# Patient Record
Sex: Male | Born: 1950 | Race: White | Hispanic: No | Marital: Married | Smoking: Former smoker
Health system: Southern US, Community
[De-identification: ages and names within clinical notes are randomized; demographics above are authoritative.]

## PROBLEM LIST (undated history)

## (undated) DIAGNOSIS — I77819 Aortic ectasia, unspecified site: Secondary | ICD-10-CM

## (undated) DIAGNOSIS — Z87442 Personal history of urinary calculi: Secondary | ICD-10-CM

## (undated) DIAGNOSIS — R0600 Dyspnea, unspecified: Secondary | ICD-10-CM

## (undated) DIAGNOSIS — R011 Cardiac murmur, unspecified: Secondary | ICD-10-CM

## (undated) DIAGNOSIS — R1310 Dysphagia, unspecified: Secondary | ICD-10-CM

## (undated) DIAGNOSIS — N200 Calculus of kidney: Secondary | ICD-10-CM

## (undated) DIAGNOSIS — K219 Gastro-esophageal reflux disease without esophagitis: Secondary | ICD-10-CM

## (undated) DIAGNOSIS — R002 Palpitations: Secondary | ICD-10-CM

## (undated) DIAGNOSIS — E785 Hyperlipidemia, unspecified: Secondary | ICD-10-CM

## (undated) DIAGNOSIS — R0602 Shortness of breath: Secondary | ICD-10-CM

## (undated) DIAGNOSIS — Z860100 Personal history of colon polyps, unspecified: Secondary | ICD-10-CM

## (undated) DIAGNOSIS — I350 Nonrheumatic aortic (valve) stenosis: Secondary | ICD-10-CM

## (undated) DIAGNOSIS — R351 Nocturia: Secondary | ICD-10-CM

## (undated) DIAGNOSIS — I77811 Abdominal aortic ectasia: Secondary | ICD-10-CM

## (undated) HISTORY — PX: POLYPECTOMY: SHX149

## (undated) HISTORY — DX: Personal history of colon polyps, unspecified: Z86.0100

## (undated) HISTORY — DX: Cardiac murmur, unspecified: R01.1

## (undated) HISTORY — DX: Shortness of breath: R06.02

## (undated) HISTORY — DX: Abdominal aortic ectasia: I77.811

## (undated) HISTORY — DX: Dysphagia, unspecified: R13.10

## (undated) HISTORY — DX: Nonrheumatic aortic (valve) stenosis: I35.0

## (undated) HISTORY — DX: Calculus of kidney: N20.0

## (undated) HISTORY — PX: VASECTOMY: SHX75

## (undated) HISTORY — DX: Aortic ectasia, unspecified site: I77.819

## (undated) HISTORY — DX: Personal history of urinary calculi: Z87.442

## (undated) HISTORY — PX: COLONOSCOPY: SHX174

## (undated) HISTORY — DX: Nocturia: R35.1

## (undated) HISTORY — DX: Gastro-esophageal reflux disease without esophagitis: K21.9

## (undated) HISTORY — DX: Palpitations: R00.2

## (undated) HISTORY — DX: Personal history of colonic polyps: Z86.010

## (undated) HISTORY — DX: Hyperlipidemia, unspecified: E78.5

## (undated) HISTORY — DX: Dyspnea, unspecified: R06.00

---

## 1999-08-22 ENCOUNTER — Encounter: Payer: Self-pay | Admitting: Emergency Medicine

## 1999-08-22 ENCOUNTER — Emergency Department (HOSPITAL_COMMUNITY): Admission: EM | Admit: 1999-08-22 | Discharge: 1999-08-22 | Payer: Self-pay | Admitting: Emergency Medicine

## 2003-11-28 ENCOUNTER — Ambulatory Visit (HOSPITAL_COMMUNITY): Admission: RE | Admit: 2003-11-28 | Discharge: 2003-11-28 | Payer: Self-pay | Admitting: Gastroenterology

## 2005-03-03 HISTORY — PX: KIDNEY STONE SURGERY: SHX686

## 2005-04-11 ENCOUNTER — Encounter: Admission: RE | Admit: 2005-04-11 | Discharge: 2005-07-10 | Payer: Self-pay | Admitting: Family Medicine

## 2005-12-15 ENCOUNTER — Ambulatory Visit (HOSPITAL_BASED_OUTPATIENT_CLINIC_OR_DEPARTMENT_OTHER): Admission: RE | Admit: 2005-12-15 | Discharge: 2005-12-15 | Payer: Self-pay | Admitting: Urology

## 2005-12-26 ENCOUNTER — Ambulatory Visit (HOSPITAL_COMMUNITY): Admission: RE | Admit: 2005-12-26 | Discharge: 2005-12-26 | Payer: Self-pay | Admitting: Urology

## 2012-03-23 ENCOUNTER — Encounter: Payer: Self-pay | Admitting: Gastroenterology

## 2012-04-19 ENCOUNTER — Ambulatory Visit (AMBULATORY_SURGERY_CENTER): Payer: PRIVATE HEALTH INSURANCE | Admitting: *Deleted

## 2012-04-19 VITALS — Ht 70.0 in | Wt 187.6 lb

## 2012-04-19 DIAGNOSIS — Z86010 Personal history of colonic polyps: Secondary | ICD-10-CM

## 2012-04-19 DIAGNOSIS — Z1211 Encounter for screening for malignant neoplasm of colon: Secondary | ICD-10-CM

## 2012-04-19 MED ORDER — MOVIPREP 100 G PO SOLR: ORAL | Status: DC

## 2012-04-19 NOTE — Progress Notes (Signed)
Patient had colonoscopy with Dr.Weissman 2005 and 2008. Adenomatous Polyp removed in 2005. Reports given by patient. Sent to be scanned into chart. Copies also given to [facility].

## 2012-05-03 ENCOUNTER — Ambulatory Visit (AMBULATORY_SURGERY_CENTER): Payer: PRIVATE HEALTH INSURANCE | Admitting: Gastroenterology

## 2012-05-03 ENCOUNTER — Encounter: Payer: Self-pay | Admitting: Gastroenterology

## 2012-05-03 VITALS — BP 114/71 | HR 71 | Temp 97.9°F | Resp 20 | Ht 70.0 in | Wt 187.0 lb

## 2012-05-03 DIAGNOSIS — D126 Benign neoplasm of colon, unspecified: Secondary | ICD-10-CM

## 2012-05-03 DIAGNOSIS — Z1211 Encounter for screening for malignant neoplasm of colon: Secondary | ICD-10-CM

## 2012-05-03 DIAGNOSIS — K5730 Diverticulosis of large intestine without perforation or abscess without bleeding: Secondary | ICD-10-CM

## 2012-05-03 DIAGNOSIS — Z86010 Personal history of colonic polyps: Secondary | ICD-10-CM

## 2012-05-03 MED ORDER — SODIUM CHLORIDE 0.9 % IV SOLN: 500.0000 mL | INTRAVENOUS | Status: DC

## 2012-05-03 NOTE — Progress Notes (Signed)
Patient did not experience any of the following events: a burn prior to discharge; a fall within the facility; wrong site/side/patient/procedure/implant event; or a hospital transfer or hospital admission upon discharge from the facility. (G8907) Patient did not have preoperative order for IV antibiotic SSI prophylaxis. (G8918)  

## 2012-05-03 NOTE — Patient Instructions (Signed)
YOU HAD AN ENDOSCOPIC PROCEDURE TODAY AT THE [facility]: Refer to the procedure report that was given to you for any specific questions about what was found during the examination.  If the procedure report does not answer your questions, please call your gastroenterologist to clarify.  If you requested that your care partner not be given the details of your procedure findings, then the procedure report has been included in a sealed envelope for you to review at your convenience later.  YOU SHOULD EXPECT: Some feelings of bloating in the abdomen. Passage of more gas than usual.  Walking can help get rid of the air that was put into your GI tract during the procedure and reduce the bloating. If you had a lower endoscopy (such as a colonoscopy or flexible sigmoidoscopy) you may notice spotting of blood in your stool or on the toilet paper. If you underwent a bowel prep for your procedure, then you may not have a normal bowel movement for a few days.  DIET: Your first meal following the procedure should be a light meal and then it is ok to progress to your normal diet.  A half-sandwich or bowl of soup is an example of a good first meal.  Heavy or fried foods are harder to digest and may make you feel nauseous or bloated.  Likewise meals heavy in dairy and vegetables can cause extra gas to form and this can also increase the bloating.  Drink plenty of fluids but you should avoid alcoholic beverages for 24 hours.  ACTIVITY: Your care partner should take you home directly after the procedure.  You should plan to take it easy, moving slowly for the rest of the day.  You can resume normal activity the day after the procedure however you should NOT DRIVE or use heavy machinery for 24 hours (because of the sedation medicines used during the test).    SYMPTOMS TO REPORT IMMEDIATELY: A gastroenterologist can be reached at any hour.  During normal business hours, 8:30 AM to 5:00 PM Monday through Friday,  call [phone].  After hours and on weekends, please call the GI answering service at [phone] who will take a message and have the physician on call contact you.   Following lower endoscopy (colonoscopy or flexible sigmoidoscopy):  Excessive amounts of blood in the stool  Significant tenderness or worsening of abdominal pains  Swelling of the abdomen that is new, acute  Fever of 100F or higher    FOLLOW UP: If any biopsies were taken you will be contacted by phone or by letter within the next 1-3 weeks.  Call your gastroenterologist if you have not heard about the biopsies in 3 weeks.  Our staff will call the home number listed on your records the next business day following your procedure to check on you and address any questions or concerns that you may have at that time regarding the information given to you following your procedure. This is a courtesy call and so if there is no answer at the home number and we have not heard from you through the emergency physician on call, we will assume that you have returned to your regular daily activities without incident.  SIGNATURES/CONFIDENTIALITY: You and/or your care partner have signed paperwork which will be entered into your electronic medical record.  These signatures attest to the fact that that the information above on your After Visit Summary has been reviewed and is understood.  Full responsibility of the confidentiality   of this discharge information lies with you and/or your care-partner.     

## 2012-05-03 NOTE — Op Note (Signed)
[facility] [address] [place], [address]   COLONOSCOPY PROCEDURE REPORT  PATIENT: Christian Galvan, Christian Galvan.  MR#: [id number] BIRTHDATE: 12/08/1950 , 61  yrs. old GENDER: Male ENDOSCOPIST: Rachael Fee, MD REFERRED LK:[id number] Cyndie Mull, M.D. PROCEDURE DATE:  05/03/2012 PROCEDURE:   Colonoscopy with snare polypectomy ASA CLASS:   Class II INDICATIONS:1.5cm adenomatous polyp removed 2005 (Dr.  Melchor Amour); repeat colonoscoyp 2008 was normal. MEDICATIONS: Fentanyl 50 mcg IV, Versed 6 mg IV, and These medications were titrated to patient response per physician's verbal order  DESCRIPTION OF PROCEDURE:   After the risks benefits and alternatives of the procedure were thoroughly explained, informed consent was obtained.  A digital rectal exam revealed no abnormalities of the rectum.   The LB [id number]  endoscope was introduced through the anus and advanced to the cecum, which was identified by both the appendix and ileocecal valve. No adverse events experienced.   The quality of the prep was good, using MoviPrep  The instrument was then slowly withdrawn as the colon was fully examined.  COLON FINDINGS: Two small polyps were found, removed and sent to pathology.  These were both sessile, 3-18mm across, located in transverse and rectum segments, removed with cold snare.  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  The examination was otherwise normal.  Retroflexed views revealed no abnormalities. The time to cecum=2 minutes 29 seconds.  Withdrawal time=9 minutes 03 seconds.  The scope was withdrawn and the procedure completed. COMPLICATIONS: There were no complications.  ENDOSCOPIC IMPRESSION: Two small polyps were found, removed and sent to pathology. 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 The examination was otherwise normal.  RECOMMENDATIONS: Given your personal history of adenomatous (pre-cancerous) polyps, you will need a repeat colonoscopy in 5  years even if the polyps removed today are NOT pre-cancerous   eSigned:  Rachael Fee, MD 05/03/2012 11:23 AM

## 2012-05-04 ENCOUNTER — Telehealth: Payer: Self-pay | Admitting: *Deleted

## 2012-05-04 NOTE — Telephone Encounter (Signed)
  Follow up Call-  Call back number 05/03/2012  Post procedure Call Back phone  # [phone]  Permission to leave phone message Yes     Patient questions:  Do you have a fever, pain , or abdominal swelling? no Pain Score  0 *  Have you tolerated food without any problems? yes  Have you been able to return to your normal activities? yes  Do you have any questions about your discharge instructions: Diet   no Medications  no Follow up visit  no  Do you have questions or concerns about your Care? no  Actions: * If pain score is 4 or above: No action needed, pain <4. Pt requested referring MD be corrected to Martha Clan instead of Buren Kos as on report , this chart given to [employer] who is going to change that in epic.

## 2012-05-04 NOTE — Telephone Encounter (Signed)
Called pt to let him know william and Buren Kos same MD as pt had been concerned that his report sent to wrong MD.

## 2012-05-10 ENCOUNTER — Encounter: Payer: Self-pay | Admitting: Gastroenterology

## 2016-05-16 ENCOUNTER — Other Ambulatory Visit: Payer: Self-pay | Admitting: Internal Medicine

## 2016-05-16 DIAGNOSIS — R011 Cardiac murmur, unspecified: Secondary | ICD-10-CM

## 2016-05-30 ENCOUNTER — Encounter (INDEPENDENT_AMBULATORY_CARE_PROVIDER_SITE_OTHER): Payer: Self-pay

## 2016-05-30 ENCOUNTER — Other Ambulatory Visit: Payer: Self-pay

## 2016-05-30 ENCOUNTER — Ambulatory Visit (HOSPITAL_COMMUNITY): Payer: Commercial Managed Care - HMO | Attending: Cardiology

## 2016-05-30 DIAGNOSIS — I071 Rheumatic tricuspid insufficiency: Secondary | ICD-10-CM | POA: Insufficient documentation

## 2016-05-30 DIAGNOSIS — I351 Nonrheumatic aortic (valve) insufficiency: Secondary | ICD-10-CM | POA: Insufficient documentation

## 2016-05-30 DIAGNOSIS — R011 Cardiac murmur, unspecified: Secondary | ICD-10-CM | POA: Insufficient documentation

## 2016-05-30 DIAGNOSIS — E785 Hyperlipidemia, unspecified: Secondary | ICD-10-CM | POA: Insufficient documentation

## 2017-05-13 ENCOUNTER — Encounter: Payer: Self-pay | Admitting: Gastroenterology

## 2017-06-16 ENCOUNTER — Ambulatory Visit (AMBULATORY_SURGERY_CENTER): Payer: Self-pay | Admitting: *Deleted

## 2017-06-16 ENCOUNTER — Other Ambulatory Visit: Payer: Self-pay

## 2017-06-16 ENCOUNTER — Encounter: Payer: Self-pay | Admitting: Gastroenterology

## 2017-06-16 VITALS — Ht 69.75 in | Wt 190.0 lb

## 2017-06-16 DIAGNOSIS — Z86010 Personal history of colonic polyps: Secondary | ICD-10-CM

## 2017-06-16 MED ORDER — PEG 3350-KCL-NA BICARB-NACL 420 G PO SOLR: 4000.0000 mL | Freq: Once | ORAL | 0 refills | Status: AC

## 2017-06-16 NOTE — Progress Notes (Signed)
No egg or soy allergy known to patient  No issues with past sedation with any surgeries  or procedures, no intubation problems - pt states slow to wake  No diet pills per patient No home 02 use per patient  No blood thinners per patient  Pt denies issues with constipation  No A fib or A flutter  EMMI video sent to pt's e mail - pt declined

## 2017-06-30 ENCOUNTER — Other Ambulatory Visit: Payer: Self-pay

## 2017-06-30 ENCOUNTER — Encounter: Payer: Self-pay | Admitting: Gastroenterology

## 2017-06-30 ENCOUNTER — Ambulatory Visit (AMBULATORY_SURGERY_CENTER): Payer: 59 | Admitting: Gastroenterology

## 2017-06-30 VITALS — BP 123/81 | HR 82 | Temp 98.2°F | Resp 10 | Ht 69.75 in | Wt 190.0 lb

## 2017-06-30 DIAGNOSIS — Z86010 Personal history of colonic polyps: Secondary | ICD-10-CM

## 2017-06-30 DIAGNOSIS — D125 Benign neoplasm of sigmoid colon: Secondary | ICD-10-CM | POA: Diagnosis not present

## 2017-06-30 DIAGNOSIS — D123 Benign neoplasm of transverse colon: Secondary | ICD-10-CM

## 2017-06-30 DIAGNOSIS — D122 Benign neoplasm of ascending colon: Secondary | ICD-10-CM

## 2017-06-30 MED ORDER — SODIUM CHLORIDE 0.9 % IV SOLN: 500.0000 mL | Freq: Once | INTRAVENOUS | Status: AC

## 2017-06-30 NOTE — Op Note (Signed)
[facility] Patient Name: Christian Galvan Procedure Date: 06/30/2017 1:47 PM MRN: [id number] Endoscopist: Milus Banister , MD Age: 67 Referring MD:  Date of Birth: 05/02/1950 Gender: Male Account #: [id number] Procedure:                Colonoscopy Indications:              High risk colon cancer surveillance: Personal                            history of colonic polyps; 1.5cm adenomatous polyp                            removed 2005 (Dr. Redmond School); repeat colonoscopy Dr.                            Redmond School 2008 was normal; colonoscopy 2014 Dr.                            Ardis Hughs found two subCM adenomas. Medicines:                Monitored Anesthesia Care Procedure:                Pre-Anesthesia Assessment:                           - Prior to the procedure, a History and Physical                            was performed, and patient medications and                            allergies were reviewed. The patient's tolerance of                            previous anesthesia was also reviewed. The risks                            and benefits of the procedure and the sedation                            options and risks were discussed with the patient.                            All questions were answered, and informed consent                            was obtained. Prior Anticoagulants: The patient has                            taken no previous anticoagulant or antiplatelet                            agents. ASA Grade Assessment: II - A patient with  mild systemic disease. After reviewing the risks                            and benefits, the patient was deemed in                            satisfactory condition to undergo the procedure.                           After obtaining informed consent, the colonoscope                            was passed under direct vision. Throughout the                            procedure, the patient's blood  pressure, pulse, and                            oxygen saturations were monitored continuously. The                            Colonoscope was introduced through the anus and                            advanced to the the cecum, identified by                            appendiceal orifice and ileocecal valve. The                            colonoscopy was performed without difficulty. The                            patient tolerated the procedure well. The quality                            of the bowel preparation was good. The ileocecal                            valve, appendiceal orifice, and rectum were                            photographed. Scope In: 1:54:26 PM Scope Out: 2:06:15 PM Scope Withdrawal Time: 0 hours 9 minutes 57 seconds  Total Procedure Duration: 0 hours 11 minutes 49 seconds  Findings:                 Two sessile polyps were found in the transverse                            colon and ascending colon. The polyps were 2 to 3                            mm in size. These polyps were removed with a cold  snare. Resection and retrieval were complete.                           Lipomatous (more than usual) IC valve.                           The exam was otherwise without abnormality on                            direct and retroflexion views. Complications:            No immediate complications. Estimated blood loss:                            None. Estimated Blood Loss:     Estimated blood loss: none. Impression:               - Two 2 to 3 mm polyps in the transverse colon and                            in the ascending colon, removed with a cold snare.                            Resected and retrieved.                           - The examination was otherwise normal on direct                            and retroflexion views. Recommendation:           - Patient has a contact number available for                            emergencies. The  signs and symptoms of potential                            delayed complications were discussed with the                            patient. Return to normal activities tomorrow.                            Written discharge instructions were provided to the                            patient.                           - Resume previous diet.                           - Continue present medications.                           You will receive a letter within 2-3 weeks with the  pathology results and my final recommendations.                           If the polyp(s) is proven to be 'pre-cancerous' on                            pathology, you will need repeat colonoscopy in 5                            years. Milus Banister, MD 06/30/2017 2:12:52 PM This report has been signed electronically.

## 2017-06-30 NOTE — Progress Notes (Signed)
Report given to PACU, vss 

## 2017-06-30 NOTE — Patient Instructions (Signed)
YOU HAD AN ENDOSCOPIC PROCEDURE TODAY AT THE [facility]:   Refer to the procedure report that was given to you for any specific questions about what was found during the examination.  If the procedure report does not answer your questions, please call your gastroenterologist to clarify.  If you requested that your care partner not be given the details of your procedure findings, then the procedure report has been included in a sealed envelope for you to review at your convenience later.  YOU SHOULD EXPECT: Some feelings of bloating in the abdomen. Passage of more gas than usual.  Walking can help get rid of the air that was put into your GI tract during the procedure and reduce the bloating. If you had a lower endoscopy (such as a colonoscopy or flexible sigmoidoscopy) you may notice spotting of blood in your stool or on the toilet paper. If you underwent a bowel prep for your procedure, you may not have a normal bowel movement for a few days.  Please Note:  You might notice some irritation and congestion in your nose or some drainage.  This is from the oxygen used during your procedure.  There is no need for concern and it should clear up in a day or so.  SYMPTOMS TO REPORT IMMEDIATELY:   Following lower endoscopy (colonoscopy or flexible sigmoidoscopy):  Excessive amounts of blood in the stool  Significant tenderness or worsening of abdominal pains  Swelling of the abdomen that is new, acute  Fever of 100F or higher  Please see handouts given to you on polyps.  For urgent or emergent issues, a gastroenterologist can be reached at any hour by calling [phone].   DIET:  We do recommend a small meal at first, but then you may proceed to your regular diet.  Drink plenty of fluids but you should avoid alcoholic beverages for 24 hours.  ACTIVITY:  You should plan to take it easy for the rest of today and you should NOT DRIVE or use heavy machinery until tomorrow (because of the  sedation medicines used during the test).    FOLLOW UP: Our staff will call the number listed on your records the next business day following your procedure to check on you and address any questions or concerns that you may have regarding the information given to you following your procedure. If we do not reach you, we will leave a message.  However, if you are feeling well and you are not experiencing any problems, there is no need to return our call.  We will assume that you have returned to your regular daily activities without incident.  If any biopsies were taken you will be contacted by phone or by letter within the next 1-3 weeks.  Please call us at [phone] if you have not heard about the biopsies in 3 weeks.    SIGNATURES/CONFIDENTIALITY: You and/or your care partner have signed paperwork which will be entered into your electronic medical record.  These signatures attest to the fact that that the information above on your After Visit Summary has been reviewed and is understood.  Full responsibility of the confidentiality of this discharge information lies with you and/or your care-partner.  Thank you for letting us take care of your healthcare needs today. 

## 2017-06-30 NOTE — Progress Notes (Signed)
Called to room to assist during endoscopic procedure.  Patient ID and intended procedure confirmed with present staff. Received instructions for my participation in the procedure from the performing physician.  

## 2017-06-30 NOTE — Progress Notes (Signed)
Pt. Reports no change in his medical or surgical history since his pre-visit 06/16/2017.

## 2017-07-01 ENCOUNTER — Telehealth: Payer: Self-pay

## 2017-07-01 NOTE — Telephone Encounter (Signed)
  Follow up Call-  Call Corrisa Gibby number 06/30/2017  Post procedure Call Lucus Lambertson phone  # [phone]  Permission to leave phone message Yes  Some recent data might be hidden     Patient questions:  Do you have a fever, pain , or abdominal swelling? No. Pain Score  0 *  Have you tolerated food without any problems? Yes.    Have you been able to return to your normal activities? Yes.    Do you have any questions about your discharge instructions: Diet   No. Medications  No. Follow up visit  No.  Do you have questions or concerns about your Care? No.  Actions: * If pain score is 4 or above: No action needed, pain <4.

## 2017-07-07 ENCOUNTER — Encounter: Payer: Self-pay | Admitting: Gastroenterology

## 2018-05-24 ENCOUNTER — Other Ambulatory Visit: Payer: Self-pay | Admitting: Internal Medicine

## 2018-05-24 DIAGNOSIS — Z0000 Encounter for general adult medical examination without abnormal findings: Secondary | ICD-10-CM

## 2018-07-20 ENCOUNTER — Ambulatory Visit: Payer: 59

## 2018-08-03 ENCOUNTER — Ambulatory Visit
Admission: RE | Admit: 2018-08-03 | Discharge: 2018-08-03 | Disposition: A | Discharge status: Home / Self Care | Payer: Medicare Other | Source: Ambulatory Visit | Attending: Internal Medicine | Admitting: Internal Medicine

## 2018-08-03 DIAGNOSIS — Z0000 Encounter for general adult medical examination without abnormal findings: Secondary | ICD-10-CM

## 2019-03-29 ENCOUNTER — Ambulatory Visit: Payer: Medicare Other

## 2019-04-07 ENCOUNTER — Ambulatory Visit: Payer: Medicare Other

## 2019-04-15 ENCOUNTER — Ambulatory Visit: Payer: Medicare Other

## 2019-04-26 ENCOUNTER — Other Ambulatory Visit (HOSPITAL_COMMUNITY): Payer: Self-pay | Admitting: Internal Medicine

## 2019-04-26 DIAGNOSIS — I350 Nonrheumatic aortic (valve) stenosis: Secondary | ICD-10-CM

## 2019-05-10 ENCOUNTER — Ambulatory Visit (HOSPITAL_COMMUNITY): Payer: Medicare Other | Attending: Cardiology

## 2019-05-10 ENCOUNTER — Other Ambulatory Visit: Payer: Self-pay

## 2019-05-10 DIAGNOSIS — I350 Nonrheumatic aortic (valve) stenosis: Secondary | ICD-10-CM

## 2019-05-25 NOTE — Progress Notes (Signed)
CARDIOLOGY CONSULT NOTE       Patient ID: Christian Galvan MRN: [id number] DOB/AGE: 67-Jul-1952 67 y.o.  Admit date: (Not on file) Referring Physician: Brigitte Pulse Primary Physician: Marton Redwood, MD Primary Cardiologist: New Reason for Consultation: Aortic Valve Disease  Active Problems:   * No active hospital problems. *   HPI:  69 y.o. referred by Dr Brigitte Pulse for aortic valve disease, palpitations. He is a former smoker with HLD on statin .  He had TTE done 05/30/16 with EF 60-65% mild AS/AR mean gradient 13 mmHg peak 25 mmHg Aortic root measured 4.4 cm. F/U echo done 05/10/19 showed progression of disease with mild to moderate AR and moderate AS mean gradient 18 peak 31.5 mmHg DVI 0.33 and AVA 1.54 cm2 Aortic root measured at 4.3 cm He has also had and abdominal US with ectasia of the abdominal aorta but only measuring 2.7 cm  Done as a AAA screening exam  He drinks 4 or >days/week with 3-4 drinks/day Quit smoking in 1970   ECG NSR normal Labs ok Hct 49.8  LDL 48 TSH 1.3  CXR NAD  Complains of resting dyspnea and then palpitations  No chest pain Just started going back to [facility] but sedentary with COVID Dyspnea worse last 2 months especially after lunch   For 2 years he has been active at [facility] without issue. He and wife are retired. 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 Need for aortic aneurysm surveillance W/U of ventricular Ectopy and need to r/o concomitant CAD   ROS All other systems reviewed and negative except as noted above  Past Medical History:  Diagnosis Date  . Abdominal aortic ectasia ([place])   . Aortic dilatation ([facility])   . Aortic stenosis   . Dysphagia   . Dyspnea   . GERD (gastroesophageal reflux disease)   . History of kidney stones   . Hx of colonic polyps   . Hyperlipidemia   . Nephrolithiasis   . Nocturia   . Palpitations   . SOB  (shortness of breath)     Family History  Problem Relation Age of Onset  . Heart disease Father   . Other Mother        old age  . Healthy Sister   . Colon cancer Neg Hx   . Colon polyps Neg Hx   . Esophageal cancer Neg Hx   . Rectal cancer Neg Hx   . Stomach cancer Neg Hx     Social History   Socioeconomic History  . Marital status: Married    Spouse name: Not on file  . Number of children: Not on file  . Years of education: Not on file  . Highest education level: Not on file  Occupational History  . Not on file  Tobacco Use  . Smoking status: Former Smoker    Types: Cigarettes  . Smokeless tobacco: Never Used  Substance and Sexual Activity  . Alcohol use: Yes    Alcohol/week: 2.0 standard drinks    Types: 2 Glasses of wine per week    Comment: wine nightly  . Drug use: No  . Sexual activity: Not on file  Other Topics Concern  . Not on file  Social History Narrative  . Not on file   Social Determinants of Health   Financial Resource Strain:   . Difficulty of  Paying Living Expenses:   Food Insecurity:   . Worried About Charity fundraiser in the Last Year:   . Arboriculturist in the Last Year:   Transportation Needs:   . Film/video editor (Medical):   Marland Kitchen Lack of Transportation (Non-Medical):   Physical Activity:   . Days of Exercise per Week:   . Minutes of Exercise per Session:   Stress:   . Feeling of Stress :   Social Connections:   . Frequency of Communication with Friends and Family:   . Frequency of Social Gatherings with Friends and Family:   . Attends Religious Services:   . Active Member of Clubs or Organizations:   . Attends Archivist Meetings:   Marland Kitchen Marital Status:   Intimate Partner Violence:   . Fear of Current or Ex-Partner:   . Emotionally Abused:   Marland Kitchen Physically Abused:   . Sexually Abused:     Past Surgical History:  Procedure Laterality Date  . COLONOSCOPY  2005&2008   Dr.Weissman  . KIDNEY STONE SURGERY  2007  .  POLYPECTOMY    . VASECTOMY        Current Outpatient Medications:  .  atorvastatin (LIPITOR) 20 MG tablet, , Disp: , Rfl: 11 .  pantoprazole (PROTONIX) 40 MG tablet, Take 40 mg by mouth daily., Disp: , Rfl:   Current Facility-Administered Medications:  .  0.9 %  sodium chloride infusion, 500 mL, Intravenous, Once, Milus Banister, MD  . sodium chloride      Physical Exam: Blood pressure 118/78, pulse 94, height 5\' 9"  (1.753 m), weight 200 lb (90.7 kg), SpO2 98 %.    Affect appropriate Healthy:  appears stated age 69: normal Neck supple with no adenopathy JVP normal no bruits no thyromegaly Lungs clear with no wheezing and good diaphragmatic motion Heart:  S1/S2 preserved AS/AR  murmur, no rub, gallop or click PMI normal Abdomen: benighn, BS positve, no tenderness, no AAA no bruit.  No HSM or HJR Distal pulses intact with no bruits No edema Neuro non-focal Skin warm and dry No muscular weakness    Radiology: ECHOCARDIOGRAM COMPLETE  Result Date: 05/10/2019    ECHOCARDIOGRAM REPORT   Patient Name:   Christian Galvan [facility] Date of Exam: 05/10/2019 Medical Rec #:  [id number]        Height:       69.8 in Accession #:    [id number]       Weight:       190.0 lb Date of Birth:  03/21/1950         BSA:          2.037 m Patient Age:    69 years         BP:           110/60 mmHg Patient Gender: M                HR:           86 bpm. Exam Location:  [place] Procedure: 2D Echo, 3D Echo, Cardiac Doppler and Color Doppler Indications:    I35.0 Aortic Stenosis  History:        Patient has prior history of Echocardiogram examinations, most                 recent 05/30/2016. Aortic Valve Disease, Signs/Symptoms:Shortness                 of Breath; Risk Factors:Dyslipidemia, Former Smoker  and Family                 History of Coronary Artery Disease. Palpitations, Aortic                 Stenosis.  Sonographer:    Deliah Boston RDCS Referring Phys: [place]  1. Left  ventricular ejection fraction, by estimation, is 65 to 70%. The left ventricle has normal function. The left ventricle has no regional wall motion abnormalities. Left ventricular diastolic parameters are indeterminate.  2. Right ventricular systolic function is normal. The right ventricular size is normal. Tricuspid regurgitation signal is inadequate for assessing PA pressure.  3. The mitral valve is normal in structure. No evidence of mitral valve regurgitation.  4. The aortic valve is abnormal. Moderately calcified. Aortic valve regurgitation is mild to moderate. Moderate aortic valve stenosis. Vmax 3.0, MG 20 mmHg, AVA 1.7 cm^2, DI 0.3  5. Aortic dilatation noted. There is dilatation of the ascending aorta measuring 43 mm.  6. The inferior vena cava is normal in size with greater than 50% respiratory variability, suggesting right atrial pressure of 3 mmHg. FINDINGS  Left Ventricle: Left ventricular ejection fraction, by estimation, is 65 to 70%. The left ventricle has normal function. The left ventricle has no regional wall motion abnormalities. The left ventricular internal cavity size was normal in size. There is  no left ventricular hypertrophy. Left ventricular diastolic parameters are indeterminate. Right Ventricle: The right ventricular size is normal. No increase in right ventricular wall thickness. Right ventricular systolic function is normal. Tricuspid regurgitation signal is inadequate for assessing PA pressure. Left Atrium: Left atrial size was normal in size. Right Atrium: Right atrial size was normal in size. Pericardium: There is no evidence of pericardial effusion. Mitral Valve: The mitral valve is normal in structure. No evidence of mitral valve regurgitation. Tricuspid Valve: The tricuspid valve is normal in structure. Tricuspid valve regurgitation is trivial. Aortic Valve: The aortic valve is abnormal. Aortic valve regurgitation is mild to moderate. Aortic regurgitation PHT measures 400 msec.  Moderate aortic stenosis is present. There is moderate calcification of the aortic valve. Aortic valve mean gradient measures 18.0 mmHg. Aortic valve peak gradient measures 31.5 mmHg. Aortic valve area, by VTI measures 1.77 cm. Pulmonic Valve: The pulmonic valve was not well visualized. Pulmonic valve regurgitation is trivial. Aorta: Aortic dilatation noted. There is dilatation of the ascending aorta measuring 43 mm. Venous: The inferior vena cava is normal in size with greater than 50% respiratory variability, suggesting right atrial pressure of 3 mmHg. IAS/Shunts: The interatrial septum was not well visualized.  LEFT VENTRICLE PLAX 2D LVIDd:         4.12 cm  Diastology LVIDs:         2.46 cm  LV e' lateral:   6.37 cm/s LV PW:         0.88 cm  LV E/e' lateral: 9.6 LV IVS:        0.86 cm  LV e' medial:    5.44 cm/s LVOT diam:     2.60 cm  LV E/e' medial:  11.2 LV SV:         107 LV SV Index:   52 LVOT Area:     5.31 cm  RIGHT VENTRICLE RV S prime:     11.80 cm/s TAPSE (M-mode): 1.9 cm LEFT ATRIUM             Index       RIGHT ATRIUM  Index LA diam:        3.70 cm 1.82 cm/m  RA Area:     14.40 cm LA Vol (A2C):   51.4 ml 25.23 ml/m RA Volume:   33.80 ml  16.59 ml/m LA Vol (A4C):   36.6 ml 17.96 ml/m LA Biplane Vol: 44.7 ml 21.94 ml/m  AORTIC VALVE AV Area (Vmax):    1.54 cm AV Area (Vmean):   1.52 cm AV Area (VTI):     1.77 cm AV Vmax:           280.67 cm/s AV Vmean:          195.333 cm/s AV VTI:            0.604 m AV Peak Grad:      31.5 mmHg AV Mean Grad:      18.0 mmHg LVOT Vmax:         81.30 cm/s LVOT Vmean:        56.000 cm/s LVOT VTI:          0.201 m LVOT/AV VTI ratio: 0.33 AI PHT:            400 msec  AORTA Ao Root diam: 3.75 cm Ao Asc diam:  4.27 cm MITRAL VALVE MV Area (PHT): cm          SHUNTS MV Decel Time: 275 msec     Systemic VTI:  0.20 m MV E velocity: 61.10 cm/s   Systemic Diam: 2.60 cm MV A velocity: 100.00 cm/s MV E/A ratio:  0.61 Oswaldo Milian MD Electronically signed  by Oswaldo Milian MD Signature Date/Time: 05/10/2019/8:19:16 PM    Final     EKG: SR rate 94 normal QT PVCls    ASSESSMENT AND PLAN:   1. 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 AS/AR note surgical with preserved LV function will need f/u echo in a year and close f/u   2. Aortic Aneurysm:  Moderate dilatation on limited view of ascending root TTE. Particularly in setting of possible bicuspid morphology need CTA to further evaluate entire root. His HR is too high with PVCls to do cardiac CTA so will do non gated chest CTA   3. HLD  Continue statin labs with primary   4. PVC;s will get 14 day Zio patch to quant itate and lexiscan myovue to r/o CAD suspect he will benefit from initiation of beta blocker after testing done   Signed: Jenkins Rouge 05/27/2019, 3:32 PM

## 2019-05-27 ENCOUNTER — Encounter: Payer: Self-pay | Admitting: *Deleted

## 2019-05-27 ENCOUNTER — Encounter: Payer: Self-pay | Admitting: Cardiovascular Disease

## 2019-05-27 ENCOUNTER — Other Ambulatory Visit: Payer: Self-pay

## 2019-05-27 ENCOUNTER — Ambulatory Visit: Payer: Medicare Other | Admitting: Cardiovascular Disease

## 2019-05-27 ENCOUNTER — Telehealth: Payer: Self-pay | Admitting: Radiology

## 2019-05-27 VITALS — BP 118/78 | HR 94 | Ht 69.0 in | Wt 200.0 lb

## 2019-05-27 DIAGNOSIS — I350 Nonrheumatic aortic (valve) stenosis: Secondary | ICD-10-CM | POA: Diagnosis not present

## 2019-05-27 DIAGNOSIS — I493 Ventricular premature depolarization: Secondary | ICD-10-CM | POA: Diagnosis not present

## 2019-05-27 DIAGNOSIS — R0602 Shortness of breath: Secondary | ICD-10-CM

## 2019-05-27 DIAGNOSIS — I491 Atrial premature depolarization: Secondary | ICD-10-CM | POA: Diagnosis not present

## 2019-05-27 NOTE — Patient Instructions (Addendum)
Medication Instructions:  Your physician recommends that you continue on your current medications as directed. Please refer to the Current Medication list given to you today.  *If you need a refill on your cardiac medications before your next appointment, please call your pharmacy*   Lab Work: TODAY:  BMET  If you have labs (blood work) drawn today and your tests are completely normal, you will receive your results only by: Marland Kitchen MyChart Message (if you have MyChart) OR . A paper copy in the mail If you have any lab test that is abnormal or we need to change your treatment, we will call you to review the results.   Testing/Procedures: Non-Cardiac CT Angiography (CTA), is a special type of CT scan that uses a computer to produce multi-dimensional views of major blood vessels throughout the body. In CT angiography, a contrast material is injected through an IV to help visualize the blood vessels   Your physician has requested that you have a lexiscan myoview. For further information please visit [email or web address]. Please follow instruction sheet, as given.  ZIO XT- Long Term Monitor Instructions   Your physician has requested you wear your ZIO patch monitor 14 days.   This is a single patch monitor.  [employer] supplies one patch monitor per enrollment.  Additional stickers are not available.   Please do not apply patch if you will be having a Nuclear Stress Test, Echocardiogram, Cardiac CT, MRI, or Chest Xray during the time frame you would be wearing the monitor. The patch cannot be worn during these tests.  You cannot remove and re-apply the ZIO XT patch monitor.   Your ZIO patch monitor will be sent USPS Priority mail from [facility] directly to your home address. The monitor may also be mailed to a PO BOX if home delivery is not available.   It may take 3-5 days to receive your monitor after you have been enrolled.   Once you have received you monitor, please review enclosed  instructions.  Your monitor has already been registered assigning a specific monitor serial # to you.   Applying the monitor   Shave hair from upper left chest.   Hold abrader disc by orange tab.  Rub abrader in 40 strokes over left upper chest as indicated in your monitor instructions.   Clean area with 4 enclosed alcohol pads .  Use all pads to assure are is cleaned thoroughly.  Let dry.   Apply patch as indicated in monitor instructions.  Patch will be place under collarbone on left side of chest with arrow pointing upward.   Rub patch adhesive wings for 2 minutes.Remove white label marked "1".  Remove white label marked "2".  Rub patch adhesive wings for 2 additional minutes.   While looking in a mirror, press and release button in center of patch.  A small green light will flash 3-4 times .  This will be your only indicator the monitor has been turned on.     Do not shower for the first 24 hours.  You may shower after the first 24 hours.   Press button if you feel a symptom. You will hear a small click.  Record Date, Time and Symptom in the Patient Log Book.   When you are ready to remove patch, follow instructions on last 2 pages of Patient Log Book.  Stick patch monitor onto last page of Patient Log Book.   Place Patient Log Book in [place] box.  Use locking tab on box  and tape box closed securely.  The [employer] has [employer] on it.  Please place in mailbox as soon as possible.  Your physician should have your test results approximately 7 days after the monitor has been mailed back to [facility].   Call [place] at [phone] if you have questions regarding your ZIO XT patch monitor.  Call them immediately if you see an orange light blinking on your monitor.   If your monitor falls off in less than 4 days contact our Monitor department at [phone].  If your monitor becomes loose or falls off after 4 days call [employer] at [phone] for  suggestions on securing your monitor.     Follow-Up: At [facility], you and your health needs are our priority.  As part of our continuing mission to provide you with exceptional heart care, we have created designated Provider Care Teams.  These Care Teams include your primary Cardiologist (physician) and Advanced Practice Providers (APPs -  Physician Assistants and Nurse Practitioners) who all work together to provide you with the care you need, when you need it.  We recommend signing up for the patient portal called "MyChart".  Sign up information is provided on this After Visit Summary.  MyChart is used to connect with patients for Virtual Visits (Telemedicine).  Patients are able to view lab/test results, encounter notes, upcoming appointments, etc.  Non-urgent messages can be sent to your provider as well.   To learn more about what you can do with MyChart, go to [email or web address].    Your next appointment:   After all tests have been completed  The format for your next appointment:   In Person  Provider:   Jenkins Rouge, MD   Other Instructions  Cardiac Nuclear Scan A cardiac nuclear scan is a test that is done to check the flow of blood to your heart. It is done when you are resting and when you are exercising. The test looks for problems such as:  Not enough blood reaching a portion of the heart.  The heart muscle not working as it should. You may need this test if:  You have heart disease.  You have had lab results that are not normal.  You have had heart surgery or a balloon procedure to open up blocked arteries (angioplasty).  You have chest pain.  You have shortness of breath. In this test, a special dye (tracer) is put into your bloodstream. The tracer will travel to your heart. A camera will then take pictures of your heart to see how the tracer moves through your heart. This test is usually done at a hospital and takes 2-4 hours. Tell a doctor  about:  Any allergies you have.  All medicines you are taking, including vitamins, herbs, eye drops, creams, and over-the-counter medicines.  Any problems you or family members have had with anesthetic medicines.  Any blood disorders you have.  Any surgeries you have had.  Any medical conditions you have.  Whether you are pregnant or may be pregnant. What are the risks? Generally, this is a safe test. However, problems may occur, such as:  Serious chest pain and heart attack. This is only a risk if the stress portion of the test is done.  Rapid heartbeat.  A feeling of warmth in your chest. This feeling usually does not last long.  Allergic reaction to the tracer. What happens before the test?  Ask your doctor about changing or stopping your normal  medicines. This is important.  Follow instructions from your doctor about what you cannot eat or drink.  Remove your jewelry on the day of the test. What happens during the test?  An IV tube will be inserted into one of your veins.  Your doctor will give you a small amount of tracer through the IV tube.  You will wait for 20-40 minutes while the tracer moves through your bloodstream.  Your heart will be monitored with an electrocardiogram (ECG).  You will lie down on an exam table.  Pictures of your heart will be taken for about 15-20 minutes.  You may also have a stress test. For this test, one of these things may be done: ? You will be asked to exercise on a treadmill or a stationary bike. ? You will be given medicines that will make your heart work harder. This is done if you are unable to exercise.  When blood flow to your heart has peaked, a tracer will again be given through the IV tube.  After 20-40 minutes, you will get back on the exam table. More pictures will be taken of your heart.  Depending on the tracer that is used, more pictures may need to be taken 3-4 hours later.  Your IV tube will be removed when  the test is over. The test may vary among doctors and hospitals. What happens after the test?  Ask your doctor: ? Whether you can return to your normal schedule, including diet, activities, and medicines. ? Whether you should drink more fluids. This will help to remove the tracer from your body. Drink enough fluid to keep your pee (urine) pale yellow.  Ask your doctor, or the department that is doing the test: ? When will my results be ready? ? How will I get my results? Summary  A cardiac nuclear scan is a test that is done to check the flow of blood to your heart.  Tell your doctor whether you are pregnant or may be pregnant.  Before the test, ask your doctor about changing or stopping your normal medicines. This is important.  Ask your doctor whether you can return to your normal activities. You may be asked to drink more fluids. This information is not intended to replace advice given to you by your health care provider. Make sure you discuss any questions you have with your health care provider. Document Revised: 06/09/2018 Document Reviewed: 08/03/2017 Elsevier Patient Education  2020 [place]

## 2019-05-27 NOTE — Telephone Encounter (Signed)
Enrolled patient for a 14 day Zio monitor to patients home.

## 2019-06-03 ENCOUNTER — Other Ambulatory Visit (INDEPENDENT_AMBULATORY_CARE_PROVIDER_SITE_OTHER): Payer: Medicare Other

## 2019-06-03 DIAGNOSIS — I493 Ventricular premature depolarization: Secondary | ICD-10-CM | POA: Diagnosis not present

## 2019-06-03 DIAGNOSIS — R0602 Shortness of breath: Secondary | ICD-10-CM

## 2019-06-03 DIAGNOSIS — I350 Nonrheumatic aortic (valve) stenosis: Secondary | ICD-10-CM | POA: Diagnosis not present

## 2019-06-03 DIAGNOSIS — I491 Atrial premature depolarization: Secondary | ICD-10-CM | POA: Diagnosis not present

## 2019-06-21 ENCOUNTER — Telehealth (HOSPITAL_COMMUNITY): Payer: Self-pay

## 2019-06-21 NOTE — Telephone Encounter (Signed)
Detailed instructions left on the patient's answering machine. Asked to call back with any questions. S.Eran Windish EMTP 

## 2019-06-23 ENCOUNTER — Ambulatory Visit (INDEPENDENT_AMBULATORY_CARE_PROVIDER_SITE_OTHER)
Admission: RE | Admit: 2019-06-23 | Discharge: 2019-06-23 | Disposition: A | Discharge status: Home / Self Care | Payer: Medicare Other | Source: Ambulatory Visit | Attending: Cardiovascular Disease | Admitting: Cardiovascular Disease

## 2019-06-23 ENCOUNTER — Other Ambulatory Visit: Payer: Self-pay

## 2019-06-23 ENCOUNTER — Ambulatory Visit (HOSPITAL_COMMUNITY): Payer: Medicare Other | Attending: Cardiology

## 2019-06-23 DIAGNOSIS — I493 Ventricular premature depolarization: Secondary | ICD-10-CM | POA: Insufficient documentation

## 2019-06-23 DIAGNOSIS — R0602 Shortness of breath: Secondary | ICD-10-CM

## 2019-06-23 DIAGNOSIS — I350 Nonrheumatic aortic (valve) stenosis: Secondary | ICD-10-CM | POA: Diagnosis not present

## 2019-06-23 DIAGNOSIS — I491 Atrial premature depolarization: Secondary | ICD-10-CM

## 2019-06-23 LAB — MYOCARDIAL PERFUSION IMAGING
Peak HR: 101 {beats}/min
Rest HR: 62 {beats}/min
SDS: 0
SRS: 0
SSS: 0
TID: 1.05

## 2019-06-23 MED ORDER — REGADENOSON 0.4 MG/5ML IV SOLN
0.4000 mg | Freq: Once | INTRAVENOUS | Status: AC
  Administered 2019-06-23: 0.4 mg via INTRAVENOUS

## 2019-06-23 MED ORDER — IOHEXOL 350 MG/ML SOLN
100.0000 mL | Freq: Once | INTRAVENOUS | Status: AC | PRN
  Administered 2019-06-23: 11:00:00 100 mL via INTRAVENOUS

## 2019-06-23 MED ORDER — TECHNETIUM TC 99M TETROFOSMIN IV KIT
10.1000 | PACK | Freq: Once | INTRAVENOUS | Status: AC | PRN
  Administered 2019-06-23: 10.1 via INTRAVENOUS
  Filled 2019-06-23: qty 11

## 2019-06-23 MED ORDER — TECHNETIUM TC 99M TETROFOSMIN IV KIT
32.4000 | PACK | Freq: Once | INTRAVENOUS | Status: AC | PRN
  Administered 2019-06-23: 32.4 via INTRAVENOUS
  Filled 2019-06-23: qty 33

## 2019-06-27 ENCOUNTER — Other Ambulatory Visit: Payer: Self-pay

## 2019-06-27 ENCOUNTER — Encounter: Payer: Self-pay | Admitting: Cardiovascular Disease

## 2019-06-27 ENCOUNTER — Ambulatory Visit: Payer: Medicare Other | Admitting: Cardiovascular Disease

## 2019-06-27 VITALS — BP 122/82 | HR 98 | Ht 69.0 in | Wt 200.0 lb

## 2019-06-27 DIAGNOSIS — I350 Nonrheumatic aortic (valve) stenosis: Secondary | ICD-10-CM | POA: Diagnosis not present

## 2019-06-27 MED ORDER — METOPROLOL TARTRATE 25 MG PO TABS: 25.0000 mg | ORAL_TABLET | Freq: Two times a day (BID) | ORAL | 3 refills | Status: DC

## 2019-06-27 NOTE — Patient Instructions (Signed)
Medication Instructions:  Your physician has recommended you make the following change in your medication:  1-START Metoprolol 25 mg by mouth twice daily.  *If you need a refill on your cardiac medications before your next appointment, please call your pharmacy*  Lab Work: If you have labs (blood work) drawn today and your tests are completely normal, you will receive your results only by: Marland Kitchen MyChart Message (if you have MyChart) OR . A paper copy in the mail If you have any lab test that is abnormal or we need to change your treatment, we will call you to review the results.  Follow-Up: At [facility], you and your health needs are our priority.  As part of our continuing mission to provide you with exceptional heart care, we have created designated Provider Care Teams.  These Care Teams include your primary Cardiologist (physician) and Advanced Practice Providers (APPs -  Physician Assistants and Nurse Practitioners) who all work together to provide you with the care you need, when you need it.  We recommend signing up for the patient portal called "MyChart".  Sign up information is provided on this After Visit Summary.  MyChart is used to connect with patients for Virtual Visits (Telemedicine).  Patients are able to view lab/test results, encounter notes, upcoming appointments, etc.  Non-urgent messages can be sent to your provider as well.   To learn more about what you can do with MyChart, go to [email or web address].    Your next appointment:   3 month(s)  The format for your next appointment:   In Person  Provider:   You may see Dr. Johnsie Cancel or one of the following Advanced Practice Providers on your designated Care Team:    Truitt Merle, NP  Cecilie Kicks, NP  Kathyrn Drown, NP

## 2019-06-27 NOTE — Progress Notes (Signed)
CARDIOLOGY CONSULT NOTE       Patient ID: Christian Galvan MRN: [id number] DOB/AGE: 05/20/1950 69 y.o.  Admit date: (Not on file) Referring Physician: Brigitte Pulse Primary Physician: Marton Redwood, MD Primary Cardiologist: New Reason for Consultation: Aortic Valve Disease  Active Problems:   * No active hospital problems. *   HPI:  69 y.o. referred by Dr Brigitte Pulse for aortic valve disease, palpitations. First seen on 05/27/19  He is a former smoker with HLD on statin .  He had TTE done 05/30/16 with EF 60-65% mild AS/AR mean gradient 13 mmHg peak 25 mmHg Aortic root measured 4.4 cm. F/U echo done 05/10/19 showed progression of disease with mild to moderate AR and moderate AS mean gradient 18 peak 31.5 mmHg DVI 0.33 and AVA 1.54 cm2 Aortic root measured at 4.3 cm He has also had and abdominal US with ectasia of the abdominal aorta but only measuring 2.7 cm  Done as a AAA screening exam  He drinks 4 or >days/week with 3-4 drinks/day Quit smoking in 1970   ECG NSR normal Labs ok Hct 49.8  LDL 48 TSH 1.3  CXR NAD  Complains of resting dyspnea and then palpitations  No chest pain Just started going back to [facility] but sedentary with COVID Dyspnea worse last 2 months especially after lunch   For 2 years he has been active at [facility] without issue. He and wife are retired. 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 Need for aortic aneurysm surveillance W/U of ventricular Ectopy and need to r/o concomitant CAD   Myovue done 06/23/19 normal no ischemia no EF study not gated due to PVCls CTA 06/23/19 Ascending thoracic aorta 4.3 cm   Having palpitations with sensation of rapid heart beating   ROS All other systems reviewed and negative except as noted above  Past Medical History:  Diagnosis Date  . Abdominal aortic ectasia ([place])   . Aortic dilatation (HCC)   . Aortic stenosis   .  Dysphagia   . Dyspnea   . GERD (gastroesophageal reflux disease)   . History of kidney stones   . Hx of colonic polyps   . Hyperlipidemia   . Nephrolithiasis   . Nocturia   . Palpitations   . SOB (shortness of breath)     Family History  Problem Relation Age of Onset  . Heart disease Father   . Other Mother        old age  . Healthy Sister   . Colon cancer Neg Hx   . Colon polyps Neg Hx   . Esophageal cancer Neg Hx   . Rectal cancer Neg Hx   . Stomach cancer Neg Hx     Social History   Socioeconomic History  . Marital status: Married    Spouse name: Not on file  . Number of children: Not on file  . Years of education: Not on file  . Highest education level: Not on file  Occupational History  . Not on file  Tobacco Use  . Smoking status: Former Smoker    Types: Cigarettes  . Smokeless tobacco: Never Used  Substance and Sexual Activity  . Alcohol use: Yes    Alcohol/week: 2.0 standard drinks    Types: 2 Glasses of wine per week    Comment: wine nightly  . Drug use: No  . Sexual  activity: Not on file  Other Topics Concern  . Not on file  Social History Narrative  . Not on file   Social Determinants of Health   Financial Resource Strain:   . Difficulty of Paying Living Expenses:   Food Insecurity:   . Worried About Charity fundraiser in the Last Year:   . Arboriculturist in the Last Year:   Transportation Needs:   . Film/video editor (Medical):   Marland Kitchen Lack of Transportation (Non-Medical):   Physical Activity:   . Days of Exercise per Week:   . Minutes of Exercise per Session:   Stress:   . Feeling of Stress :   Social Connections:   . Frequency of Communication with Friends and Family:   . Frequency of Social Gatherings with Friends and Family:   . Attends Religious Services:   . Active Member of Clubs or Organizations:   . Attends Archivist Meetings:   Marland Kitchen Marital Status:   Intimate Partner Violence:   . Fear of Current or Ex-Partner:     . Emotionally Abused:   Marland Kitchen Physically Abused:   . Sexually Abused:     Past Surgical History:  Procedure Laterality Date  . COLONOSCOPY  2005&2008   Dr.Weissman  . KIDNEY STONE SURGERY  2007  . POLYPECTOMY    . VASECTOMY        Current Outpatient Medications:  .  atorvastatin (LIPITOR) 20 MG tablet, Take 20 mg by mouth daily. , Disp: , Rfl: 11 .  pantoprazole (PROTONIX) 40 MG tablet, Take 40 mg by mouth daily., Disp: , Rfl:   Current Facility-Administered Medications:  .  0.9 %  sodium chloride infusion, 500 mL, Intravenous, Once, Milus Banister, MD  . sodium chloride      Physical Exam: Blood pressure 122/82, pulse 98, height 5\' 9"  (1.753 m), weight 200 lb (90.7 kg), SpO2 97 %.    Affect appropriate Healthy:  appears stated age 69: normal Neck supple with no adenopathy JVP normal no bruits no thyromegaly Lungs clear with no wheezing and good diaphragmatic motion Heart:  S1/S2 preserved AS/AR  murmur, no rub, gallop or click PMI normal Abdomen: benighn, BS positve, no tenderness, no AAA no bruit.  No HSM or HJR Distal pulses intact with no bruits No edema Neuro non-focal Skin warm and dry No muscular weakness    Radiology: CT ANGIO CHEST AORTA W/CM & OR WO/CM  Result Date: 06/24/2019 CLINICAL DATA:  Aortic valvular stenosis. EXAM: CT ANGIOGRAPHY CHEST WITH CONTRAST TECHNIQUE: Multidetector CT imaging of the chest was performed using the standard protocol during bolus administration of intravenous contrast. Multiplanar CT image reconstructions and MIPs were obtained to evaluate the vascular anatomy. CONTRAST:  153mL OMNIPAQUE IOHEXOL 350 MG/ML SOLN COMPARISON:  None. FINDINGS: Cardiovascular: Dilated ascending thoracic aorta, 4.3 cm measured approximately 2.5 cm above the aortic root. Aorta tapers gradually measuring 3.2 cm just proximal to the origin of the innominate artery, and 2.7 cm at the junction of the distal aortic arch and descending thoracic aorta.  Minor aortic atherosclerotic change. No dissection. Heart is normal in size. Calcifications are noted along the aortic valve. No coronary artery calcifications and no pericardial effusion. Pulmonary arteries are unremarkable. Mediastinum/Nodes: 9 mm left thyroid nodule. No follow-up recommended. No neck base, mediastinal or hilar masses or enlarged lymph nodes. Trachea and esophagus are unremarkable. Lungs/Pleura: Lungs are clear. No pleural effusion or pneumothorax. Upper Abdomen: No significant abnormality. Musculoskeletal: No fracture or acute  finding. No osteoblastic or osteolytic lesions. Review of the MIP images confirms the above findings. IMPRESSION: 1. Dilated ascending thoracic aorta to 4.3 cm. Given the history, this is presumably the result of aortic stenosis. Calcifications are noted along the aortic valve. Recommend annual imaging followup by CTA or MRA. This recommendation follows 2010 ACCF/AHA/AATS/ACR/ASA/SCA/SCAI/SIR/STS/SVM Guidelines for the Diagnosis and Management of Patients with Thoracic Aortic Disease. Circulation. 2010; [id number]. Aortic aneurysm NOS (ICD10-I71.9) 2. Minor a or atherosclerosis.  No dissection. 3. No other significant findings.  Lungs are clear. Aortic aneurysm NOS (ICD10-I71.9). Electronically Signed   By: Lajean Manes M.D.   On: 06/24/2019 08:49   MYOCARDIAL PERFUSION IMAGING  Result Date: 06/23/2019  There was no ST segment deviation noted during stress.  Defect 1: There is a small defect of mild severity present in the apex location.  The study is normal.  This is a low risk study.  Probably normal stress nuclear study with apical thinning but no ischemia.  Frequent PVCs and couplets noted.  Study not gated because of ectopy.    EKG: SR rate 94 normal QT PVCls    ASSESSMENT AND PLAN:   1. 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 AS/AR note surgical with preserved LV function will need f/u  echo in a year and close f/u  He has no chest pain and his myovue done 06/23/19 was normal so no apparent concomitant CAD Repeat echo March 2022   2. Aortic Aneurysm:  4.3 cm by CTA observe see below   3. HLD  Continue statin labs with primary   4. PVC;s just turned monitor in Start lopressor 25 mg bid consider EP referral if % burden very high   Signed: Jenkins Rouge 06/27/2019, 3:23 PM

## 2019-06-29 ENCOUNTER — Telehealth: Payer: Self-pay

## 2019-06-29 DIAGNOSIS — I4729 Other ventricular tachycardia: Secondary | ICD-10-CM

## 2019-06-29 DIAGNOSIS — I472 Ventricular tachycardia: Secondary | ICD-10-CM

## 2019-06-29 NOTE — Telephone Encounter (Signed)
Patient aware of results. Placed order for EP.

## 2019-06-29 NOTE — Telephone Encounter (Signed)
-----   Message from Josue Hector, MD sent at 06/29/2019  5:28 PM EDT ----- PVCl 8% burden with some NSVT He was started on lopressor 25 bid refer to EP

## 2019-08-03 ENCOUNTER — Encounter: Payer: Self-pay | Admitting: Internal Medicine

## 2019-08-03 ENCOUNTER — Other Ambulatory Visit: Payer: Self-pay

## 2019-08-03 ENCOUNTER — Ambulatory Visit: Payer: Medicare Other | Admitting: Internal Medicine

## 2019-08-03 DIAGNOSIS — I493 Ventricular premature depolarization: Secondary | ICD-10-CM | POA: Diagnosis not present

## 2019-08-03 DIAGNOSIS — I472 Ventricular tachycardia: Secondary | ICD-10-CM | POA: Diagnosis not present

## 2019-08-03 DIAGNOSIS — I4729 Other ventricular tachycardia: Secondary | ICD-10-CM

## 2019-08-03 NOTE — Patient Instructions (Addendum)
Medication Instructions:  Your physician recommends that you continue on your current medications as directed. Please refer to the Current Medication list given to you today.  Labwork: None ordered.  Testing/Procedures: None ordered.  Follow-Up: Your physician wants you to follow-up in: as needed with Dr. Taylor.      Any Other Special Instructions Will Be Listed Below (If Applicable).  If you need a refill on your cardiac medications before your next appointment, please call your pharmacy.   

## 2019-08-03 NOTE — Progress Notes (Signed)
HPI Mr. Christian Galvan is referred today by Dr. Johnsie Cancel for evaluation of PVC's, NSVT, and PAC's. He is a pleasant 69 yo man with a h/o AS/AI, who was noted to have palpitations. He wore a cardiac monitor and was found to have NSVT and was started on metoprolol. He was cautioned about his ETOH consumption. "I make my own wine." The patient notes that since he started on metoprolol, his palpitations have resolved. No other complaints.  Allergies  Allergen Reactions  . Sulfa Antibiotics Anaphylaxis and Swelling     Current Outpatient Medications  Medication Sig Dispense Refill  . atorvastatin (LIPITOR) 20 MG tablet Take 20 mg by mouth daily.   11  . metoprolol tartrate (LOPRESSOR) 25 MG tablet Take 1 tablet (25 mg total) by mouth 2 (two) times daily. 180 tablet 3  . pantoprazole (PROTONIX) 40 MG tablet Take 40 mg by mouth daily.     Current Facility-Administered Medications  Medication Dose Route Frequency Provider Last Rate Last Admin  . 0.9 %  sodium chloride infusion  500 mL Intravenous Once Milus Banister, MD         Past Medical History:  Diagnosis Date  . Abdominal aortic ectasia ([place])   . Aortic dilatation (HCC)   . Aortic stenosis   . Dysphagia   . Dyspnea   . GERD (gastroesophageal reflux disease)   . History of kidney stones   . Hx of colonic polyps   . Hyperlipidemia   . Nephrolithiasis   . Nocturia   . Palpitations   . SOB (shortness of breath)     ROS:   All systems reviewed and negative except as noted in the HPI.   Past Surgical History:  Procedure Laterality Date  . COLONOSCOPY  2005&2008   Dr.Weissman  . KIDNEY STONE SURGERY  2007  . POLYPECTOMY    . VASECTOMY       Family History  Problem Relation Age of Onset  . Heart disease Father   . Other Mother        old age  . Healthy Sister   . Colon cancer Neg Hx   . Colon polyps Neg Hx   . Esophageal cancer Neg Hx   . Rectal cancer Neg Hx   . Stomach cancer Neg Hx      Social History    Socioeconomic History  . Marital status: Married    Spouse name: Not on file  . Number of children: Not on file  . Years of education: Not on file  . Highest education level: Not on file  Occupational History  . Not on file  Tobacco Use  . Smoking status: Former Smoker    Types: Cigarettes  . Smokeless tobacco: Never Used  Substance and Sexual Activity  . Alcohol use: Yes    Alcohol/week: 2.0 standard drinks    Types: 2 Glasses of wine per week    Comment: wine nightly  . Drug use: No  . Sexual activity: Not on file  Other Topics Concern  . Not on file  Social History Narrative  . Not on file   Social Determinants of Health   Financial Resource Strain:   . Difficulty of Paying Living Expenses:   Food Insecurity:   . Worried About Charity fundraiser in the Last Year:   . Arboriculturist in the Last Year:   Transportation Needs:   . Film/video editor (Medical):   Marland Kitchen Lack of Transportation (  Non-Medical):   Physical Activity:   . Days of Exercise per Week:   . Minutes of Exercise per Session:   Stress:   . Feeling of Stress :   Social Connections:   . Frequency of Communication with Friends and Family:   . Frequency of Social Gatherings with Friends and Family:   . Attends Religious Services:   . Active Member of Clubs or Organizations:   . Attends Archivist Meetings:   Marland Kitchen Marital Status:   Intimate Partner Violence:   . Fear of Current or Ex-Partner:   . Emotionally Abused:   Marland Kitchen Physically Abused:   . Sexually Abused:      BP 126/78   Pulse (!) 56   Ht 5\' 9"  (1.753 m)   Wt 200 lb 3.2 oz (90.8 kg)   SpO2 98%   BMI 29.56 kg/m   Physical Exam:  Well appearing NAD HEENT: Unremarkable Neck:  No JVD, no thyromegally Lymphatics:  No adenopathy Back:  No CVA tenderness Lungs:  Clear HEART:  Regular rate rhythm, 2/6 high frequency murmur of AS, no rubs, no clicks Abd:  soft, positive bowel sounds, no organomegally, no rebound, no  guarding Ext:  2 plus pulses, no edema, no cyanosis, no clubbing Skin:  No rashes no nodules Neuro:  CN II through XII intact, motor grossly intact  Cardiac monitor - removed  Assess/Plan: 1. NSVT/PVC's - His symptoms are much improved. He will continue his beta blocker. I encouraged him on the consumption of ETOH in moderation. He uses caffeine minimally. 2. AS/AI - as followed by Dr. Johnsie Cancel. His AS is moderated with a mean gradient of 18.  [email or web address].

## 2019-09-16 NOTE — Progress Notes (Signed)
CARDIOLOGY CONSULT NOTE       Patient ID: Christian Galvan MRN: [id number] DOB/AGE: 04-17-1950 69 y.o.  Referring Physician: Brigitte Pulse Primary Physician: Marton Redwood, MD Primary Cardiologist: New Reason for Consultation: Aortic Valve Disease  Active Problems:   * No active hospital problems. *   HPI:  69 y.o. referred by Dr Brigitte Pulse 05/27/19 for aortic valve disease, palpitations.  He is a former smoker with HLD on statin .  He had TTE done 05/30/16 with EF 60-65% mild AS/AR mean gradient 13 mmHg peak 25 mmHg Aortic root measured 4.4 cm. F/U echo done 05/10/19 showed progression of disease with mild to moderate AR and moderate AS mean gradient 18 peak 31.5 mmHg DVI 0.33 and AVA 1.54 cm2 Aortic root measured at 4.3 cm He has also had and abdominal US with ectasia of the abdominal aorta but only measuring 2.7 cm  Done as a AAA screening exam  Myovue done 06/23/19 normal no ischemia no EF study not gated due to PVCls CTA 06/23/19 Ascending thoracic aorta 4.3 cm   Monitor done for palpitations 06/29/19 and noted NSVT and 8% PVC burden Started on lopressor 25 mg bid. 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 [id number] in [place]   Discussed need to see dentist every 6 months   ROS All other systems reviewed and negative except as noted above  Past Medical History:  Diagnosis Date   Abdominal aortic ectasia (HCC)    Aortic dilatation (HCC)    Aortic stenosis    Dysphagia    Dyspnea    GERD (gastroesophageal reflux disease)    History of kidney stones    Hx of colonic polyps    Hyperlipidemia    Nephrolithiasis    Nocturia    Palpitations    SOB (shortness of breath)     Family History  Problem  Relation Age of Onset   Heart disease Father    Other Mother        old age   Healthy Sister    Colon cancer Neg Hx    Colon polyps Neg Hx    Esophageal cancer Neg Hx    Rectal cancer Neg Hx    Stomach cancer Neg Hx     Social History   Socioeconomic History   Marital status: Married    Spouse name: Not on file   Number of children: Not on file   Years of education: Not on file   Highest education level: Not on file  Occupational History   Not on file  Tobacco Use   Smoking status: Former Smoker    Types: Cigarettes   Smokeless tobacco: Never Used  Substance and Sexual Activity   Alcohol use: Yes    Alcohol/week: 2.0 standard drinks    Types: 2 Glasses of wine per week    Comment: wine nightly   Drug use: No   Sexual activity: Not on file  Other Topics Concern   Not on file  Social History Narrative   Not on file   Social Determinants of Health   Financial Resource Strain:    Difficulty of Paying Living Expenses:   Food Insecurity:  Worried About Charity fundraiser in the Last Year:    Arboriculturist in the Last Year:   Transportation Needs:    Film/video editor (Medical):    Lack of Transportation (Non-Medical):   Physical Activity:    Days of Exercise per Week:    Minutes of Exercise per Session:   Stress:    Feeling of Stress :   Social Connections:    Frequency of Communication with Friends and Family:    Frequency of Social Gatherings with Friends and Family:    Attends Religious Services:    Active Member of Clubs or Organizations:    Attends Music therapist:    Marital Status:   Intimate Partner Violence:    Fear of Current or Ex-Partner:    Emotionally Abused:    Physically Abused:    Sexually Abused:     Past Surgical History:  Procedure Laterality Date   COLONOSCOPY  2005&2008   [place] SURGERY  2007   POLYPECTOMY     VASECTOMY        Current  Outpatient Medications:    atorvastatin (LIPITOR) 20 MG tablet, Take 20 mg by mouth daily. , Disp: , Rfl: 11   metoprolol tartrate (LOPRESSOR) 25 MG tablet, Take 1 tablet (25 mg total) by mouth 2 (two) times daily., Disp: 180 tablet, Rfl: 3   pantoprazole (PROTONIX) 40 MG tablet, Take 40 mg by mouth daily., Disp: , Rfl:   Current Facility-Administered Medications:    0.9 %  sodium chloride infusion, 500 mL, Intravenous, Once, Milus Banister, MD   sodium chloride      Physical Exam: Blood pressure (!) 144/64, pulse 70, height 5\' 9"  (1.753 m), weight 202 lb (91.6 kg), SpO2 98 %.    Affect appropriate Healthy:  appears stated age 5: normal Neck supple with no adenopathy JVP normal no bruits no thyromegaly Lungs clear with no wheezing and good diaphragmatic motion Heart:  S1/S2 preserved AS/AR  murmur, no rub, gallop or click PMI normal Abdomen: benighn, BS positve, no tenderness, no AAA no bruit.  No HSM or HJR Distal pulses intact with no bruits No edema Neuro non-focal Skin warm and dry No muscular weakness    Radiology: No results found.  EKG: SR rate 94 normal QT PVCls    ASSESSMENT AND PLAN:   1. 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 AS/AR note surgical with preserved LV function will need f/u echo in a year and close f/u  He has no chest pain and his myovue done 06/23/19 was normal so no apparent concomitant CAD Repeat echo March 2022   2. Aortic Aneurysm:  4.3 cm by CTA observe see below   3. HLD  Continue statin labs with primary   4. PVC;s ? Elated to ETOH improved with beta blocker if symptoms recur can repeat monitor for PvC burden Seen by Dr Lovena Le and no other Rx recommended other than beta blocker   5. ETOH:  Discussed need to cut back and not have daily wine/drinks   F/U March 2022 with echo for AS   Signed: Jenkins Rouge 09/29/2019, 10:24 AM

## 2019-09-29 ENCOUNTER — Ambulatory Visit: Payer: Medicare Other | Admitting: Cardiovascular Disease

## 2019-09-29 ENCOUNTER — Encounter: Payer: Self-pay | Admitting: Cardiovascular Disease

## 2019-09-29 ENCOUNTER — Other Ambulatory Visit: Payer: Self-pay

## 2019-09-29 VITALS — BP 144/64 | HR 70 | Ht 69.0 in | Wt 202.0 lb

## 2019-09-29 DIAGNOSIS — I359 Nonrheumatic aortic valve disorder, unspecified: Secondary | ICD-10-CM

## 2019-09-29 NOTE — Patient Instructions (Addendum)
Medication Instructions:   *If you need a refill on your cardiac medications before your next appointment, please call your pharmacy*  Lab Work:  If you have labs (blood work) drawn today and your tests are completely normal, you will receive your results only by: Marland Kitchen MyChart Message (if you have MyChart) OR . A paper copy in the mail If you have any lab test that is abnormal or we need to change your treatment, we will call you to review the results.  Testing/Procedures: Your physician has requested that you have an echocardiogram in March. Echocardiography is a painless test that uses sound waves to create images of your heart. It provides your doctor with information about the size and shape of your heart and how well your heart's chambers and valves are working. This procedure takes approximately one hour. There are no restrictions for this procedure.  Follow-Up: At [facility], you and your health needs are our priority.  As part of our continuing mission to provide you with exceptional heart care, we have created designated Provider Care Teams.  These Care Teams include your primary Cardiologist (physician) and Advanced Practice Providers (APPs -  Physician Assistants and Nurse Practitioners) who all work together to provide you with the care you need, when you need it.  We recommend signing up for the patient portal called "MyChart".  Sign up information is provided on this After Visit Summary.  MyChart is used to connect with patients for Virtual Visits (Telemedicine).  Patients are able to view lab/test results, encounter notes, upcoming appointments, etc.  Non-urgent messages can be sent to your provider as well.   To learn more about what you can do with MyChart, go to [email or web address].    Your next appointment:   7 months  The format for your next appointment:   In Person  Provider:   You may see Dr. Johnsie Cancel or one of the following Advanced Practice Providers on your  designated Care Team:    Truitt Merle, NP  Cecilie Kicks, NP  Kathyrn Drown, NP

## 2020-03-26 ENCOUNTER — Encounter (INDEPENDENT_AMBULATORY_CARE_PROVIDER_SITE_OTHER): Payer: Self-pay

## 2020-03-29 NOTE — Progress Notes (Signed)
CARDIOLOGY CONSULT NOTE       Patient ID: Christian Galvan MRN: [id number] DOB/AGE: 68-Jun-1952 68 y.o.  Referring Physician: Brigitte Pulse Primary Physician: Marton Redwood, MD Primary Cardiologist: New Reason for Consultation: Aortic Valve Disease  Active Problems:   * No active hospital problems. *   HPI:  70 y.o. referred by Dr Brigitte Pulse 05/27/19 for aortic valve disease, palpitations.  He is a former smoker with HLD on statin .  He had TTE done 05/30/16 with EF 60-65% mild AS/AR mean gradient 13 mmHg peak 25 mmHg Aortic root measured 4.4 cm. F/U echo done 05/10/19 showed progression of disease with mild to moderate AR and moderate AS mean gradient 18 peak 31.5 mmHg DVI 0.33 and AVA 1.54 cm2 Aortic root measured at 4.3 cm He has also had and abdominal US with ectasia of the abdominal aorta but only measuring 2.7 cm  Done as a AAA screening exam  Myovue done 06/23/19 normal no ischemia no EF study not gated due to PVCls CTA 06/23/19 Ascending thoracic aorta 4.3 cm   Monitor done for palpitations 06/29/19 and noted NSVT and 8% PVC burden Started on lopressor 25 mg bid. Had f/u with EP Dr Lovena Le 08/03/19 and symptoms  Much improved He admonished him about his ETOH and did not think further w/u needed  Has stopped drinking  Quit smoking in 1970   He and wife are retired. 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   TTE reviewed 03/30/20 mean gradient 22 peak 36 DVI 0.33 mild/moderate AR normal LV size and function   Doing well BP can run a bit low  ROS All other systems reviewed and negative except as noted above  Past Medical History:  Diagnosis Date  . Abdominal aortic ectasia ([place])   . Aortic dilatation (H[facility])   . Aortic stenosis   . Dysphagia   . Dyspnea   . GERD (gastroesophageal reflux disease)   . History of kidney stones   . Hx of colonic polyps   . Hyperlipidemia   . Nephrolithiasis   . Nocturia   . Palpitations    . SOB (shortness of breath)     Family History  Problem Relation Age of Onset  . Heart disease Father   . Other Mother        old age  . Healthy Sister   . Colon cancer Neg Hx   . Colon polyps Neg Hx   . Esophageal cancer Neg Hx   . Rectal cancer Neg Hx   . Stomach cancer Neg Hx     Social History   Socioeconomic History  . Marital status: Married    Spouse name: Not on file  . Number of children: Not on file  . Years of education: Not on file  . Highest education level: Not on file  Occupational History  . Not on file  Tobacco Use  . Smoking status: Former Smoker    Types: Cigarettes  . Smokeless tobacco: Never Used  Substance and Sexual Activity  . Alcohol use: Yes    Alcohol/week: 2.0 standard drinks    Types: 2 Glasses of wine per week    Comment: wine nightly  . Drug use: No  . Sexual activity: Not on file  Other Topics Concern  . Not on file  Social History Narrative  . Not on file   Social Determinants of Health   Financial Resource Strain: Not  on file  Food Insecurity: Not on file  Transportation Needs: Not on file  Physical Activity: Not on file  Stress: Not on file  Social Connections: Not on file  Intimate Partner Violence: Not on file    Past Surgical History:  Procedure Laterality Date  . COLONOSCOPY  2005&2008   Dr.Weissman  . KIDNEY STONE SURGERY  2007  . POLYPECTOMY    . VASECTOMY        Current Outpatient Medications:  .  atorvastatin (LIPITOR) 20 MG tablet, Take 20 mg by mouth daily. , Disp: , Rfl: 11 .  metoprolol tartrate (LOPRESSOR) 25 MG tablet, TAKE 1 TABLET BY MOUTH  TWICE DAILY, Disp: 180 tablet, Rfl: 1 .  pantoprazole (PROTONIX) 40 MG tablet, Take 40 mg by mouth daily., Disp: , Rfl:   Current Facility-Administered Medications:  .  0.9 %  sodium chloride infusion, 500 mL, Intravenous, Once, Milus Banister, MD  . sodium chloride      Physical Exam: Blood pressure 98/68, pulse 75, height 5\' 10"  (1.778 m), weight 88.9  kg, SpO2 96 %.    Affect appropriate Healthy:  appears stated age 31: normal Neck supple with no adenopathy JVP normal no bruits no thyromegaly Lungs clear with no wheezing and good diaphragmatic motion Heart:  S1/S2 preserved AS/AR  murmur, no rub, gallop or click PMI normal Abdomen: benighn, BS positve, no tenderness, no AAA no bruit.  No HSM or HJR Distal pulses intact with no bruits No edema Neuro non-focal Skin warm and dry No muscular weakness    Radiology: ECHOCARDIOGRAM COMPLETE  Result Date: 03/30/2020    ECHOCARDIOGRAM REPORT   Patient Name:   Christian Galvan [facility] Date of Exam: 03/30/2020 Medical Rec #:  [id number]        Height:       69.0 in Accession #:    [id number]       Weight:       202.0 lb Date of Birth:  Mar 21, 1950         BSA:          2.075 m Patient Age:    70 years         BP:           144/64 mmHg Patient Gender: M                HR:           66 bpm. Exam Location:  [facility] Procedure: 2D Echo, Cardiac Doppler and Color Doppler Indications:    I35.9 Aortic valve disorder  History:        Patient has prior history of Echocardiogram examinations, most                 recent 05/10/2019. Arrythmias:PVC, Signs/Symptoms:Dyspnea; Risk                 Factors:Dyslipidemia and Former Smoker. NSVT. Palpitations.                 Aortic dilatation.  Sonographer:    Diamond Nickel RCS Referring Phys: [place]  1. Left ventricular ejection fraction, by estimation, is 60 to 65%. The left ventricle has normal function. The left ventricle has no regional wall motion abnormalities. There is mild left ventricular hypertrophy. Left ventricular diastolic parameters are consistent with Grade I diastolic dysfunction (impaired relaxation).  2. Right ventricular systolic function is normal. The right ventricular size is normal.  3. The mitral valve is normal  in structure. No evidence of mitral valve regurgitation. No evidence of mitral stenosis.  4. The aortic valve is  calcified. There is moderate calcification of the aortic valve. There is moderate thickening of the aortic valve. Aortic valve regurgitation is mild to moderate. Moderate aortic valve stenosis. Aortic regurgitation PHT measures 697 msec. Aortic valve area, by VTI measures 1.15 cm. Aortic valve mean gradient measures 22.0 mmHg. Aortic valve Vmax measures 3.01 m/s.  5. The inferior vena cava is normal in size with greater than 50% respiratory variability, suggesting right atrial pressure of 3 mmHg. Comparison(s): Prior aortic valve Vmax 3.0, MG 20 mmHg. FINDINGS  Left Ventricle: Left ventricular ejection fraction, by estimation, is 60 to 65%. The left ventricle has normal function. The left ventricle has no regional wall motion abnormalities. The left ventricular internal cavity size was normal in size. There is  mild left ventricular hypertrophy. Left ventricular diastolic parameters are consistent with Grade I diastolic dysfunction (impaired relaxation). Right Ventricle: The right ventricular size is normal. No increase in right ventricular wall thickness. Right ventricular systolic function is normal. Left Atrium: Left atrial size was normal in size. Right Atrium: Right atrial size was normal in size. Pericardium: There is no evidence of pericardial effusion. Mitral Valve: The mitral valve is normal in structure. No evidence of mitral valve regurgitation. No evidence of mitral valve stenosis. Tricuspid Valve: The tricuspid valve is normal in structure. Tricuspid valve regurgitation is not demonstrated. No evidence of tricuspid stenosis. Aortic Valve: The aortic valve is calcified. There is moderate calcification of the aortic valve. There is moderate thickening of the aortic valve. Aortic valve regurgitation is mild to moderate. Aortic regurgitation PHT measures 697 msec. Moderate aortic stenosis is present. Aortic valve mean gradient measures 22.0 mmHg. Aortic valve peak gradient measures 36.2 mmHg. Aortic valve  area, by VTI measures 1.15 cm. Pulmonic Valve: The pulmonic valve was normal in structure. Pulmonic valve regurgitation is trivial. No evidence of pulmonic stenosis. Aorta: The aortic root is normal in size and structure. Venous: The inferior vena cava is normal in size with greater than 50% respiratory variability, suggesting right atrial pressure of 3 mmHg. IAS/Shunts: No atrial level shunt detected by color flow Doppler.  LEFT VENTRICLE PLAX 2D LVIDd:         4.70 cm  Diastology LVIDs:         3.00 cm  LV e' medial:    6.20 cm/s LV PW:         1.40 cm  LV E/e' medial:  12.4 LV IVS:        1.10 cm  LV e' lateral:   9.17 cm/s LVOT diam:     2.10 cm  LV E/e' lateral: 8.4 LV SV:         86 LV SV Index:   41 LVOT Area:     3.46 cm  RIGHT VENTRICLE RV Basal diam:  2.10 cm RV S prime:     11.60 cm/s TAPSE (M-mode): 2.1 cm LEFT ATRIUM             Index       RIGHT ATRIUM           Index LA diam:        3.90 cm 1.88 cm/m  RA Area:     13.50 cm LA Vol (A2C):   51.7 ml 24.92 ml/m RA Volume:   29.20 ml  14.07 ml/m LA Vol (A4C):   26.8 ml 12.92 ml/m LA Biplane Vol:  40.4 ml 19.47 ml/m  AORTIC VALVE AV Area (Vmax):    1.06 cm AV Area (Vmean):   0.93 cm AV Area (VTI):     1.15 cm AV Vmax:           301.00 cm/s AV Vmean:          220.000 cm/s AV VTI:            0.741 m AV Peak Grad:      36.2 mmHg AV Mean Grad:      22.0 mmHg LVOT Vmax:         92.00 cm/s LVOT Vmean:        59.200 cm/s LVOT VTI:          0.247 m LVOT/AV VTI ratio: 0.33 AI PHT:            697 msec  AORTA Ao Root diam: 3.30 cm MITRAL VALVE MV Area (PHT): 2.83 cm    SHUNTS MV Decel Time: 268 msec    Systemic VTI:  0.25 m MV E velocity: 76.67 cm/s  Systemic Diam: 2.10 cm MV A velocity: 72.27 cm/s MV E/A ratio:  1.06 Candee Furbish MD Electronically signed by Candee Furbish MD Signature Date/Time: 03/30/2020/1:49:03 PM    Final     EKG: SR rate 94 normal QT PVCls  04/06/2020 SR rate 75 PVC QT 388    ASSESSMENT AND PLAN:   1. Aortic Valve Disease:  Review of  echo shows fused right and left cusp with mobile non coronary cusp. AS/AR not surgical with preserved LV function will need f/u echo in a year and close f/u  He has no chest pain and his myovue done 06/23/19 was normal so no apparent concomitant CAD TTE 03/30/20 moderate AS mild/moderate AR   2. Aortic Aneurysm:  4.3 cm by CTA observe see below   3. HLD  Continue statin labs with primary   4. PVC;s ? Related to ETOH improved with beta blocker if symptoms recur can repeat monitor for PVC burden Seen by Dr Lovena Le and no other Rx recommended other than beta blocker   5. ETOH:  Has stopped drinking indicated I thought this was good for his heart   F/U 03/2021 with TTE   Signed: Jenkins Rouge 04/06/2020, 9:14 AM

## 2020-03-30 ENCOUNTER — Ambulatory Visit (HOSPITAL_COMMUNITY): Payer: Medicare Other | Attending: Cardiology

## 2020-03-30 ENCOUNTER — Other Ambulatory Visit: Payer: Self-pay

## 2020-03-30 DIAGNOSIS — I359 Nonrheumatic aortic valve disorder, unspecified: Secondary | ICD-10-CM | POA: Diagnosis not present

## 2020-03-30 LAB — ECHOCARDIOGRAM COMPLETE
AR max vel: 1.06 cm2
AV Area VTI: 1.15 cm2
AV Area mean vel: 0.93 cm2
AV Mean grad: 22 mmHg
AV Peak grad: 36.2 mmHg
Ao pk vel: 3.01 m/s
Area-P 1/2: 2.83 cm2
P 1/2 time: 697 msec
S' Lateral: 3 cm

## 2020-04-02 ENCOUNTER — Telehealth: Payer: Self-pay | Admitting: Cardiovascular Disease

## 2020-04-02 NOTE — Telephone Encounter (Signed)
I don't think dizziness related to his heart or BP He has some AS but shouldn't be causing dyspnea May need further testing I.e. cardiopulmonary stress testing

## 2020-04-02 NOTE — Telephone Encounter (Signed)
Patient aware of results of echo. Patient complaining of dizziness that comes and goes that has been going on for a while. Patient stated he does not have a BP machine at home, but one time when he was dizzy someone checked it and the SBP was 180's. Patient has an appointment this Friday with Dr. Johnsie Cancel. Encouraged patient to keep his appointment and look into getting a BP machine to check his BP and HR when he feels dizzy. Patient also complained of SOB that has been going on for months. Patient stated he thinks it is getting worse. Patient stated he was fine right now. Will forward to Dr. Johnsie Cancel for further advisement.

## 2020-04-02 NOTE — Telephone Encounter (Signed)
Patient returning call for echo results. 

## 2020-04-03 ENCOUNTER — Other Ambulatory Visit: Payer: Self-pay | Admitting: Cardiovascular Disease

## 2020-04-03 NOTE — Telephone Encounter (Signed)
Called patient back with Dr. Kyla Balzarine response. Patient will keep up coming appointment and discuss with Dr. Johnsie Cancel.

## 2020-04-06 ENCOUNTER — Ambulatory Visit: Payer: Medicare Other | Admitting: Cardiovascular Disease

## 2020-04-06 ENCOUNTER — Encounter: Payer: Self-pay | Admitting: Cardiovascular Disease

## 2020-04-06 ENCOUNTER — Other Ambulatory Visit: Payer: Self-pay

## 2020-04-06 VITALS — BP 98/68 | HR 75 | Ht 70.0 in | Wt 196.0 lb

## 2020-04-06 DIAGNOSIS — I350 Nonrheumatic aortic (valve) stenosis: Secondary | ICD-10-CM | POA: Diagnosis not present

## 2020-04-06 DIAGNOSIS — I359 Nonrheumatic aortic valve disorder, unspecified: Secondary | ICD-10-CM

## 2020-04-06 DIAGNOSIS — I493 Ventricular premature depolarization: Secondary | ICD-10-CM

## 2020-04-06 NOTE — Patient Instructions (Addendum)
Medication Instructions:  *If you need a refill on your cardiac medications before your next appointment, please call your pharmacy*  Lab Work: If you have labs (blood work) drawn today and your tests are completely normal, you will receive your results only by: [place] [facility] MyChart Message (if you have MyChart) OR . A paper copy in the mail If you have any lab test that is abnormal or we need to change your treatment, we will call you to review the results.  Testing/Procedures: Your physician has requested that you have an echocardiogram in 1 year at office visit. Echocardiography is a painless test that uses sound waves to create images of your heart. It provides your doctor with information about the size and shape of your heart and how well your heart's chambers and valves are working. This procedure takes approximately one hour. There are no restrictions for this procedure.  Follow-Up: At [facility], you and your health needs are our priority.  As part of our continuing mission to provide you with exceptional heart care, we have created designated Provider Care Teams.  These Care Teams include your primary Cardiologist (physician) and Advanced Practice Providers (APPs -  Physician Assistants and Nurse Practitioners) who all work together to provide you with the care you need, when you need it.  We recommend signing up for the patient portal called "MyChart".  Sign up information is provided on this After Visit Summary.  MyChart is used to connect with patients for Virtual Visits (Telemedicine).  Patients are able to view lab/test results, encounter notes, upcoming appointments, etc.  Non-urgent messages can be sent to your provider as well.   To learn more about what you can do with MyChart, go to [email or web address].    Your next appointment:   12 month(s)  The format for your next appointment:   In Person  Provider:   You may see Dr. Johnsie Cancel or one of the following Advanced Practice  Providers on your designated Care Team:    Kathyrn Drown, NP

## 2020-09-19 ENCOUNTER — Other Ambulatory Visit: Payer: Self-pay | Admitting: Cardiovascular Disease

## 2021-03-23 IMAGING — CT CT ANGIO CHEST
3 of 8 series · 18 of 46 positions shown · IV contrast (OMNIPAQUE 350)
Comparison: None.

CLINICAL DATA: Aortic valvular stenosis.

EXAM:
CT ANGIOGRAPHY CHEST WITH CONTRAST
TECHNIQUE: Multidetector CT imaging of the chest was performed using the
standard protocol during bolus administration of intravenous
contrast. Multiplanar CT image reconstructions and MIPs were
obtained to evaluate the vascular anatomy.
CONTRAST:  100mL OMNIPAQUE IOHEXOL 350 MG/ML SOLN

[Series 4: aorta 3.0 bf37 2 · axial · 0.71mm/px · z∈[-262,-7]mm · 13 of 101 slices shown]
[im 8/101  lung]
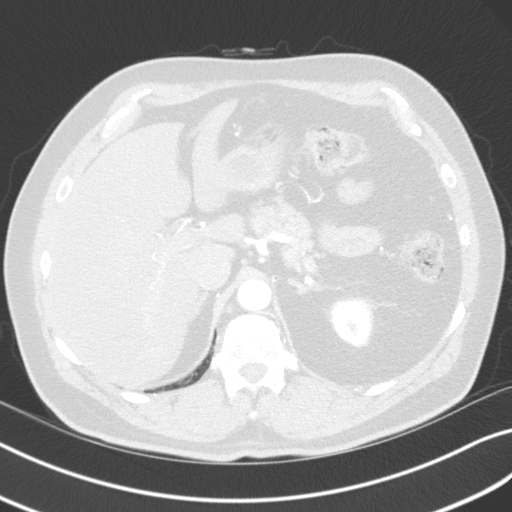
[im 15/101  soft-tissue]
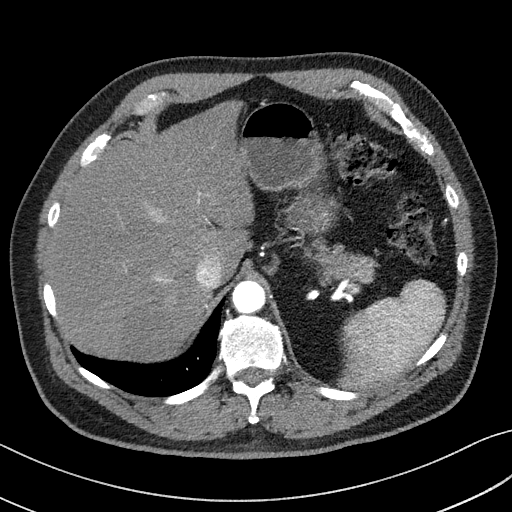
[im 22/101  lung]
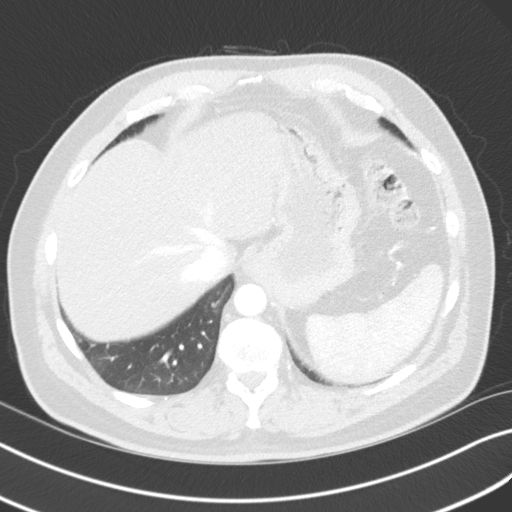
[im 29/101  soft-tissue]
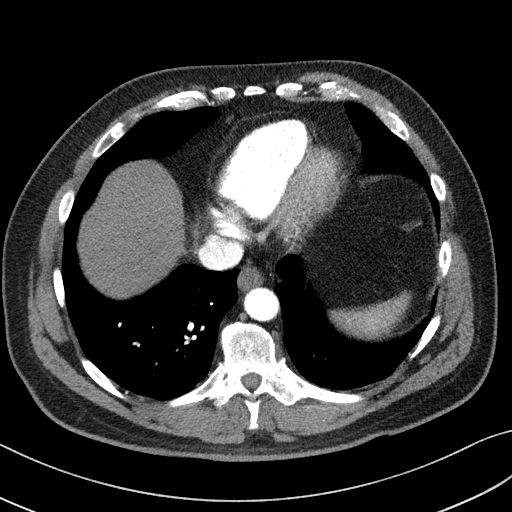
[im 36/101  lung]
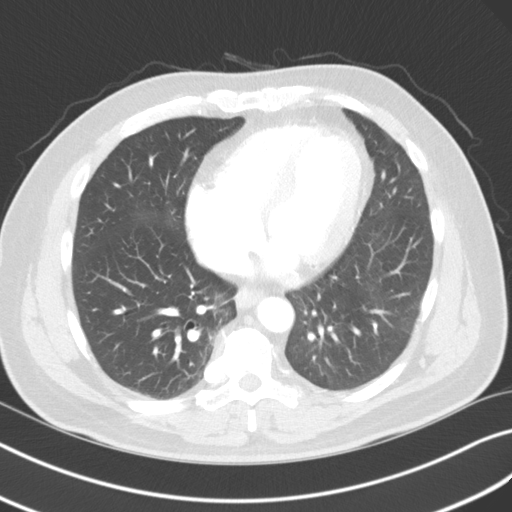
[im 43/101  soft-tissue]
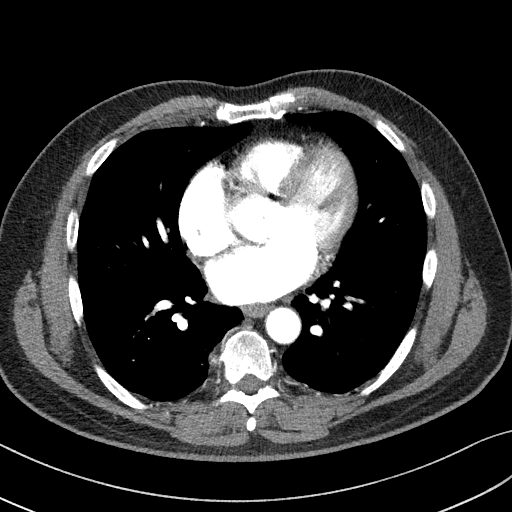
[im 51/101  lung]
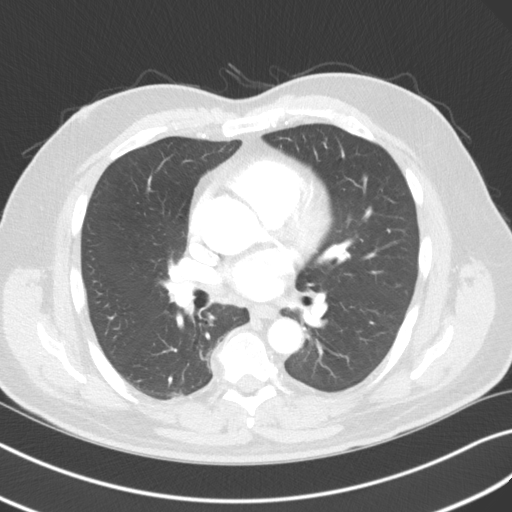
[im 58/101  soft-tissue]
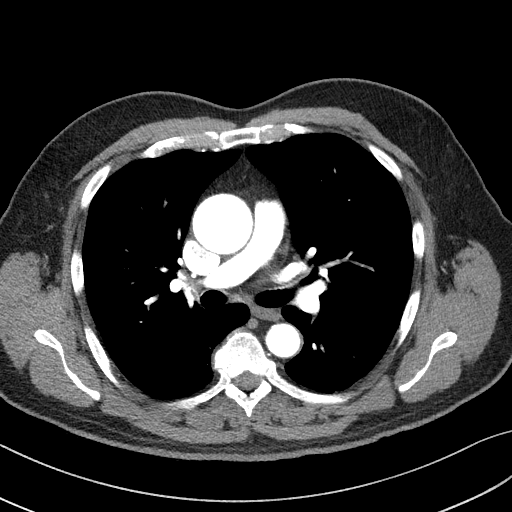
[im 65/101  lung]
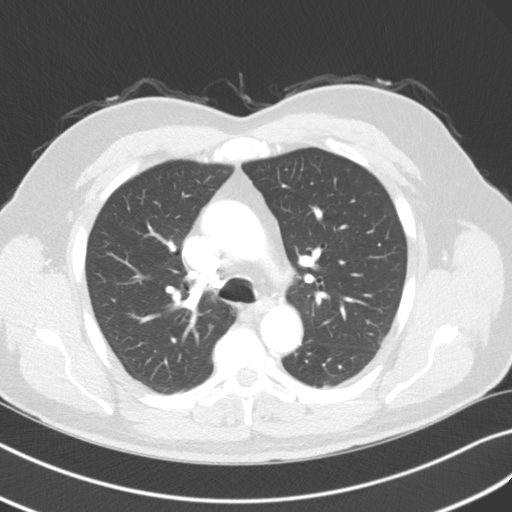
[im 72/101  soft-tissue]
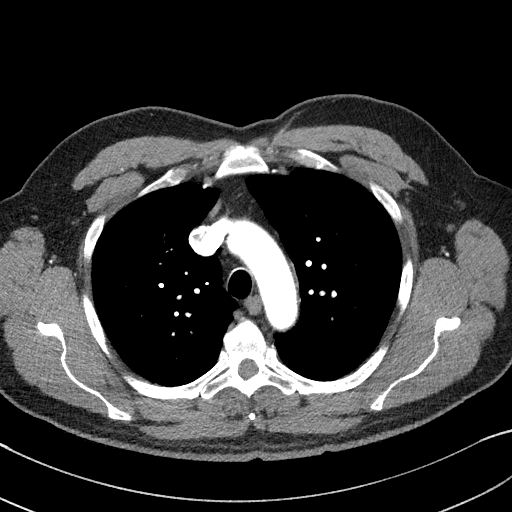
[im 79/101  lung]
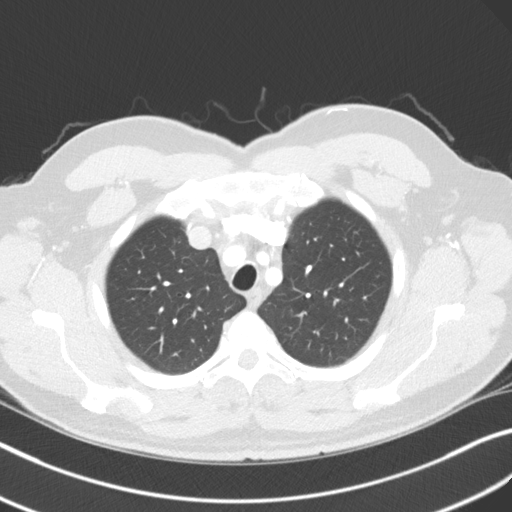
[im 86/101  soft-tissue]
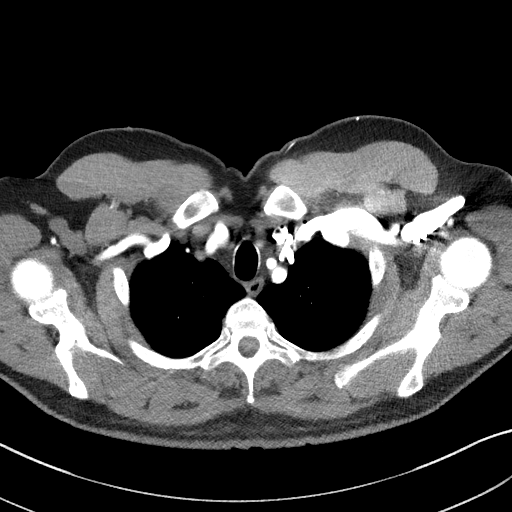
[im 93/101  lung]
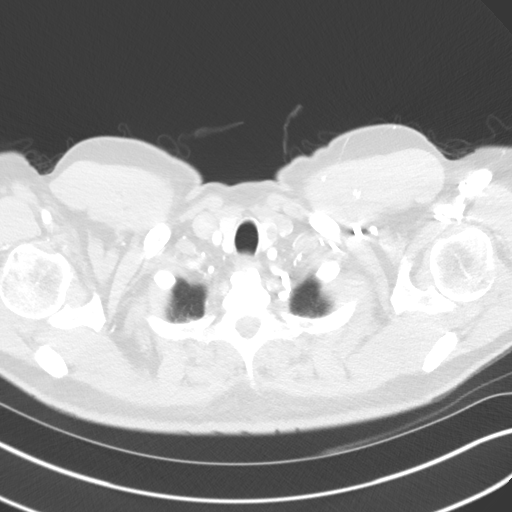

[Series 5: lung · axial · 0.71mm/px · z∈[-262,-220]mm · 2 of 101 slices shown]
[im 8/101  soft-tissue]
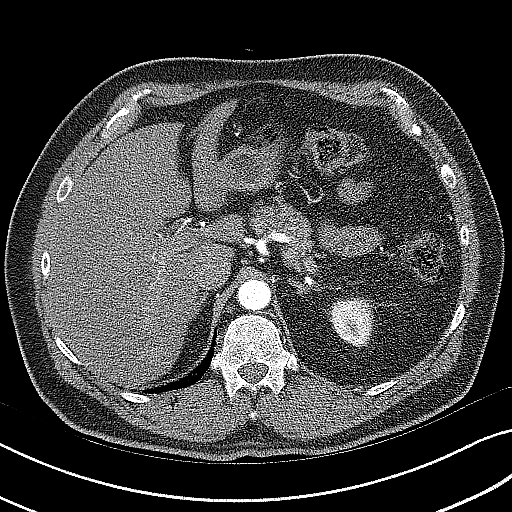
[im 22/101  soft-tissue]
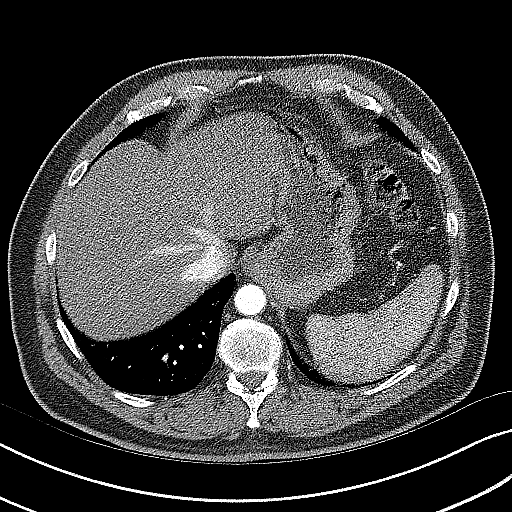

[Series 7: coronals · coronal · 0.60mm/px · 3 of 141 slices shown]
[im 36/141  soft-tissue]
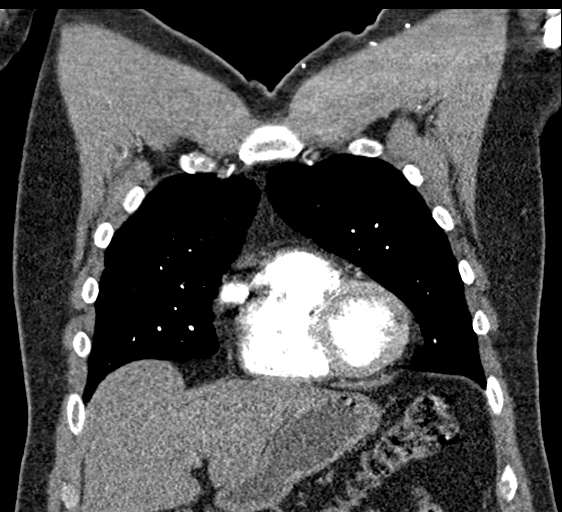
[im 71/141  soft-tissue]
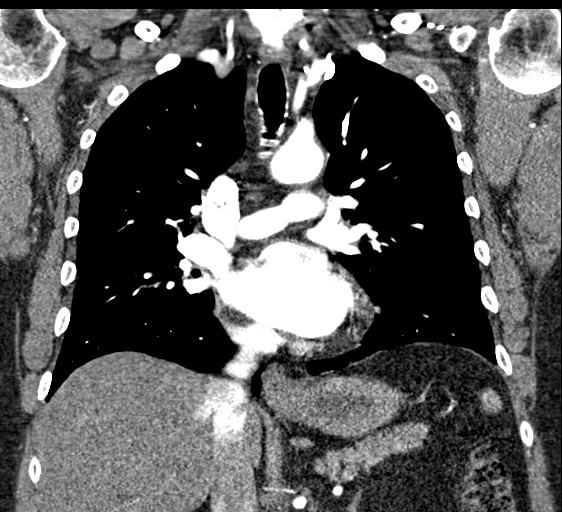
[im 106/141  soft-tissue]
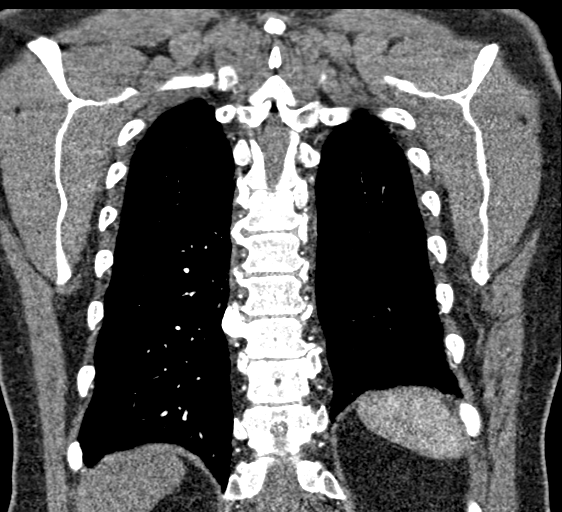

[18 of 46 positions shown; findings below may reference images not displayed]

FINDINGS: Cardiovascular: Dilated ascending thoracic aorta, 4.3 cm measured
approximately 2.5 cm above the aortic root. Aorta tapers gradually
measuring 3.2 cm just proximal to the origin of the innominate
artery, and 2.7 cm at the junction of the distal aortic arch and
descending thoracic aorta. Minor aortic atherosclerotic change. No
dissection.

Heart is normal in size. Calcifications are noted along the aortic
valve. No coronary artery calcifications and no pericardial
effusion. Pulmonary arteries are unremarkable.

Mediastinum/Nodes: 9 mm left thyroid nodule. No follow-up
recommended. No neck base, mediastinal or hilar masses or enlarged
lymph nodes. Trachea and esophagus are unremarkable.

Lungs/Pleura: Lungs are clear. No pleural effusion or pneumothorax.

Upper Abdomen: No significant abnormality.

Musculoskeletal: No fracture or acute finding. No osteoblastic or
osteolytic lesions.

Review of the MIP images confirms the above findings.
IMPRESSION: 1. Dilated ascending thoracic aorta to 4.3 cm. Given the history,
this is presumably the result of aortic stenosis. Calcifications are
noted along the aortic valve. Recommend annual imaging followup by
CTA or MRA. This recommendation follows 0525
ACCF/AHA/AATS/ACR/ASA/SCA/PADAM/BRAYAN FELIPE/KEVIN CHARLES/AUJLA Guidelines for the
Diagnosis and Management of Patients with Thoracic Aortic Disease.
Circulation. 0525; 121: E266-e369. Aortic aneurysm NOS (6SO9C-HZ8.C)
2. Minor a or atherosclerosis.  No dissection.
3. No other significant findings.  Lungs are clear.

Aortic aneurysm NOS (6SO9C-HZ8.C).

## 2021-04-18 NOTE — Progress Notes (Signed)
CARDIOLOGY CONSULT NOTE       Patient ID: Christian Galvan MRN: [id number] DOB/AGE: Jan 13, 1951 71 y.o.  Referring Physician: Brigitte Galvan Primary Physician: Christian Organ., MD Primary Cardiologist: Christian Galvan Reason for Consultation: Aortic Valve Disease  Active Problems:   * No active hospital problems. *   HPI:  71 y.o. referred by Dr Christian Galvan 05/27/19 for aortic valve disease, palpitations.  He is a former smoker with HLD on statin .  He had TTE done 05/30/16 with EF 60-65% mild AS/AR mean gradient 13 mmHg peak 25 mmHg Aortic root measured 4.4 cm. F/U echo done 05/10/19 showed progression of disease with mild to moderate AR and moderate AS mean gradient 18 peak 31.5 mmHg DVI 0.33 and AVA 1.54 cm2 Aortic root measured at 4.3 cm He has also had and abdominal US with ectasia of the abdominal aorta but only measuring 2.7 cm  Done as a AAA screening exam  Myovue done 06/23/19 normal no ischemia no EF study not gated due to PVCls CTA 06/23/19 Ascending thoracic aorta 4.3 cm   Monitor done for palpitations 06/29/19 and noted NSVT and 8% PVC burden Started on lopressor 25 mg bid. 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   TTE reviewed 03/30/20 mean gradient 22 peak 36 DVI 0.33 mild/moderate AR normal LV size and function   Not riding much Redoing kitchen No cardiac symptoms    ROS All other systems reviewed and negative except as noted above  Past Medical History:  Diagnosis Date   Abdominal aortic ectasia (HCC)    Aortic dilatation (HCC)    Aortic stenosis    Dysphagia    Dyspnea    GERD (gastroesophageal reflux disease)    History of kidney stones    Hx of colonic polyps    Hyperlipidemia    Nephrolithiasis    Nocturia     Palpitations    SOB (shortness of breath)     Family History  Problem Relation Age of Onset   Heart disease Father    Other Mother        old age   Healthy Sister    Colon cancer Neg Hx    Colon polyps Neg Hx    Esophageal cancer Neg Hx    Rectal cancer Neg Hx    Stomach cancer Neg Hx     Social History   Socioeconomic History   Marital status: Married    Spouse name: Not on file   Number of children: Not on file   Years of education: Not on file   Highest education level: Not on file  Occupational History   Not on file  Tobacco Use   Smoking status: Former    Types: Cigarettes   Smokeless tobacco: Never  Substance and Sexual Activity   Alcohol use: Yes    Alcohol/week: 2.0 standard drinks    Types: 2 Glasses of wine per week    Comment: wine nightly   Drug use: No   Sexual activity: Not on file  Other Topics Concern   Not on file  Social History Narrative   Not on file   Social Determinants of Health   Financial Resource  Strain: Not on file  Food Insecurity: Not on file  Transportation Needs: Not on file  Physical Activity: Not on file  Stress: Not on file  Social Connections: Not on file  Intimate Partner Violence: Not on file    Past Surgical History:  Procedure Laterality Date   COLONOSCOPY  2005&2008   [place] SURGERY  2007   POLYPECTOMY     VASECTOMY        Current Outpatient Medications:    atorvastatin (LIPITOR) 20 MG tablet, Take 20 mg by mouth daily. , Disp: , Rfl: 11   metoprolol tartrate (LOPRESSOR) 25 MG tablet, TAKE 1 TABLET BY MOUTH  TWICE DAILY, Disp: 180 tablet, Rfl: 3   pantoprazole (PROTONIX) 40 MG tablet, Take 40 mg by mouth daily., Disp: , Rfl:   Current Facility-Administered Medications:    0.9 %  sodium chloride infusion, 500 mL, Intravenous, Once, Christian Banister, MD   sodium chloride      Physical Exam: Blood pressure 104/62, Galvan 60, height 5\' 9"  (1.753 m), weight 199 lb (90.3 kg), SpO2 98 %.     Affect appropriate Healthy:  appears stated age 79: normal Neck supple with no adenopathy JVP normal no bruits no thyromegaly Lungs clear with no wheezing and good diaphragmatic motion Heart:  S1/S2 preserved AS/AR  murmur, no rub, gallop or click PMI normal Abdomen: benighn, BS positve, no tenderness, no AAA no bruit.  No HSM or HJR Distal pulses intact with no bruits No edema Neuro non-focal Skin warm and dry No muscular weakness    Radiology: No results found.   EKG: SR rate 94 normal QT PVCls  04/23/2021 SR rate 75 PVC QT 388 04/23/2021 SR rate 60 PAC    ASSESSMENT AND PLAN:   1. 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 AS/AR not surgical with preserved LV function  He has no chest pain and his myovue done 06/23/19 was normal so no apparent concomitant CAD  Discussed getting cardiac CTA in a year to size aorta and assess AV calcium score and CAD TTE ordered   2. Aortic Aneurysm:  4.3 cm by CTA observe see below   3. HLD  Continue statin labs with primary   4. PVC;s ? Related to ETOH improved with beta blocker if symptoms recur can repeat monitor for PVC burden Seen by Dr Christian Galvan and no other Rx recommended other than beta blocker   5. ETOH:  Has stopped drinking indicated I thought this was good for his heart   F/U Echo and see in a year if not severe   Signed: Jenkins Galvan 04/23/2021, 9:13 AM

## 2021-04-23 ENCOUNTER — Other Ambulatory Visit: Payer: Self-pay

## 2021-04-23 ENCOUNTER — Encounter: Payer: Self-pay | Admitting: Cardiovascular Disease

## 2021-04-23 ENCOUNTER — Ambulatory Visit: Payer: Medicare Other | Admitting: Cardiovascular Disease

## 2021-04-23 VITALS — BP 104/62 | HR 60 | Ht 69.0 in | Wt 199.0 lb

## 2021-04-23 DIAGNOSIS — I491 Atrial premature depolarization: Secondary | ICD-10-CM | POA: Diagnosis not present

## 2021-04-23 DIAGNOSIS — I350 Nonrheumatic aortic (valve) stenosis: Secondary | ICD-10-CM

## 2021-04-23 NOTE — Patient Instructions (Signed)
Medication Instructions:  Your physician recommends that you continue on your current medications as directed. Please refer to the Current Medication list given to you today.  *If you need a refill on your cardiac medications before your next appointment, please call your pharmacy*   Lab Work: NONE If you have labs (blood work) drawn today and your tests are completely normal, you will receive your results only by: [place] (if you have MyChart) OR A paper copy in the mail If you have any lab test that is abnormal or we need to change your treatment, we will call you to review the results.   Testing/Procedures: Your physician has requested that you have an echocardiogram. Echocardiography is a painless test that uses sound waves to create images of your heart. It provides your doctor with information about the size and shape of your heart and how well your hearts chambers and valves are working. This procedure takes approximately one hour. There are no restrictions for this procedure.    Follow-Up: At [facility], you and your health needs are our priority.  As part of our continuing mission to provide you with exceptional heart care, we have created designated Provider Care Teams.  These Care Teams include your primary Cardiologist (physician) and Advanced Practice Providers (APPs -  Physician Assistants and Nurse Practitioners) who all work together to provide you with the care you need, when you need it.  We recommend signing up for the patient portal called "MyChart".  Sign up information is provided on this After Visit Summary.  MyChart is used to connect with patients for Virtual Visits (Telemedicine).  Patients are able to view lab/test results, encounter notes, upcoming appointments, etc.  Non-urgent messages can be sent to your provider as well.   To learn more about what you can do with MyChart, go to [email or web address].    Your next appointment:   1 year(s)  The  format for your next appointment:   In Person  Provider:   DR. Johnsie Cancel

## 2021-05-08 ENCOUNTER — Other Ambulatory Visit: Payer: Self-pay

## 2021-05-08 ENCOUNTER — Ambulatory Visit (HOSPITAL_COMMUNITY): Payer: Medicare Other | Attending: Cardiovascular Disease

## 2021-05-08 DIAGNOSIS — I350 Nonrheumatic aortic (valve) stenosis: Secondary | ICD-10-CM | POA: Insufficient documentation

## 2021-05-08 LAB — ECHOCARDIOGRAM COMPLETE
AR max vel: 1.05 cm2
AV Area VTI: 1 cm2
AV Area mean vel: 0.93 cm2
AV Mean grad: 20 mmHg
AV Peak grad: 33.9 mmHg
Ao pk vel: 2.91 m/s
Area-P 1/2: 3.53 cm2
P 1/2 time: 738 msec
S' Lateral: 2.4 cm

## 2021-08-22 ENCOUNTER — Other Ambulatory Visit: Payer: Self-pay | Admitting: Cardiovascular Disease

## 2022-03-20 ENCOUNTER — Telehealth: Payer: Self-pay | Admitting: Cardiovascular Disease

## 2022-03-20 DIAGNOSIS — I350 Nonrheumatic aortic (valve) stenosis: Secondary | ICD-10-CM

## 2022-03-20 NOTE — Telephone Encounter (Signed)
Order placed for echo per Dr Johnsie Cancel.  Pt will be contacted to be scheduled.

## 2022-03-20 NOTE — Telephone Encounter (Signed)
Appears based on documentation by Dr Johnsie Cancel on echo from 3/23, pt is to have this repeated in 1 yr.  Order to be placed to f/u moderate AS.  Will send to Dr Johnsie Cancel and his RN to determine need for any other testing/orders.

## 2022-03-20 NOTE — Telephone Encounter (Signed)
Pt would like a callback if any test are needed before annul appt. On 4/25//24. Please advise

## 2022-05-01 ENCOUNTER — Other Ambulatory Visit: Payer: Self-pay | Admitting: Cardiovascular Disease

## 2022-05-16 ENCOUNTER — Ambulatory Visit (HOSPITAL_COMMUNITY): Payer: Medicare Other | Attending: Internal Medicine

## 2022-05-16 DIAGNOSIS — I350 Nonrheumatic aortic (valve) stenosis: Secondary | ICD-10-CM | POA: Insufficient documentation

## 2022-05-16 LAB — ECHOCARDIOGRAM COMPLETE
AR max vel: 0.87 cm2
AV Area VTI: 0.9 cm2
AV Area mean vel: 0.83 cm2
AV Mean grad: 24.8 mmHg
AV Peak grad: 42.4 mmHg
Ao pk vel: 3.25 m/s
Area-P 1/2: 2.81 cm2
P 1/2 time: 444 msec
S' Lateral: 2.5 cm

## 2022-06-12 NOTE — Progress Notes (Signed)
CARDIOLOGY CONSULT NOTE       Patient ID: Christian Galvan MRN: [id number] DOB/AGE: 70/10/1950 70 y.o.  Referring Physician: Clelia CroftShaw Primary Physician: Cleatis PolkaShaw, William D Jr., MD Primary Cardiologist: Eden Emmsishan   HPI:  72 y.o. referred by Dr Clelia CroftShaw 05/27/19 for aortic valve disease, palpitations.  He is a former smoker with HLD on statin .  He had TTE done 05/30/16 with EF 60-65% mild AS/AR mean gradient 13 mmHg peak 25 mmHg Aortic root measured 4.4 cm. F/U echo done 05/10/19 showed progression of disease with mild to moderate AR and moderate AS mean gradient 18 peak 31.5 mmHg DVI 0.33 and AVA 1.54 cm2 Aortic root measured at 4.3 cm He has also had and abdominal US with ectasia of the abdominal aorta but only measuring 2.7 cm  Done as a AAA screening exam  Myovue done 06/23/19 normal no ischemia no EF study not gated due to PVCls CTA 06/23/19 Ascending thoracic aorta 4.3 cm   Monitor done for palpitations 06/29/19 and noted NSVT and 8% PVC burden Started on lopressor 25 mg bid. Had f/u with EP Dr Ladona Ridgelaylor 08/03/19 and symptoms  Much improved He admonished him about his ETOH and did not think further w/u needed  Has stopped drinking  Quit smoking in 1970   He and wife are retired. 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 Has Triumph side car rig and Honda CB500x [place]   TTE reviewed 03/30/20 mean gradient 22 peak 36 DVI 0.33 mild/moderate AR normal LV size and function  TTE reviewed 05/08/21 mean gradient 20 peak 33.9 mmHg AVA 1.1 cm2 DVI 0.32  TTE reviewed 05/16/22 mean gradient 24.8 peak 42.4 mmhg with DVI 0.24 and AVA around 0.9 cm2   Discussed with patient Initially favor gated TAVR CTA to further assess valve calcium score and do with beta blocker/nitro to assess cors  He is essentially asymptomatic Has had rare SSCP after raking leaves Like [place]    ROS All other systems reviewed and negative except as noted above  Past Medical History:   Diagnosis Date   Abdominal aortic ectasia    Aortic dilatation    Aortic stenosis    Dysphagia    Dyspnea    GERD (gastroesophageal reflux disease)    History of kidney stones    Hx of colonic polyps    Hyperlipidemia    Nephrolithiasis    Nocturia    Palpitations    SOB (shortness of breath)     Family History  Problem Relation Age of Onset   Heart disease Father    Other Mother        old age   Healthy Sister    Colon cancer Neg Hx    Colon polyps Neg Hx    Esophageal cancer Neg Hx    Rectal cancer Neg Hx    Stomach cancer Neg Hx     Social History   Socioeconomic History   Marital status: Married    Spouse name: Not on file   Number of children: Not on file   Years of education: Not on file   Highest education level: Not on file  Occupational History   Not on file  Tobacco Use   Smoking status: Former    Types: Cigarettes   Smokeless tobacco: Never  Substance and Sexual Activity   Alcohol use: Yes    Alcohol/week: 2.0 standard drinks of alcohol    Types:  2 Glasses of wine per week    Comment: wine nightly   Drug use: No   Sexual activity: Not on file  Other Topics Concern   Not on file  Social History Narrative   Not on file   Social Determinants of Health   Financial Resource Strain: Not on file  Food Insecurity: Not on file  Transportation Needs: Not on file  Physical Activity: Not on file  Stress: Not on file  Social Connections: Not on file  Intimate Partner Violence: Not on file    Past Surgical History:  Procedure Laterality Date   COLONOSCOPY  2005&2008   Dr.Weissman   KIDNEY STONE SURGERY  2007   POLYPECTOMY     VASECTOMY        Current Outpatient Medications:    atorvastatin (LIPITOR) 20 MG tablet, Take 20 mg by mouth daily. , Disp: , Rfl: 11   metoprolol tartrate (LOPRESSOR) 25 MG tablet, TAKE 1 TABLET BY MOUTH TWICE  DAILY, Disp: 180 tablet, Rfl: 0   pantoprazole (PROTONIX) 40 MG tablet, Take 40 mg by mouth daily., Disp: ,  Rfl:   Current Facility-Administered Medications:    0.9 %  sodium chloride infusion, 500 mL, Intravenous, Once, Rachael Fee, MD   sodium chloride      Physical Exam: Blood pressure 100/68, pulse 64, height 5\' 9"  (1.753 m), weight 195 lb (88.5 kg), SpO2 94 %.    Affect appropriate Healthy:  appears stated age HEENT: normal Neck supple with no adenopathy JVP normal no bruits no thyromegaly Lungs clear with no wheezing and good diaphragmatic motion Heart:  S1/S2 muffled  AS/AR  murmur, no rub, gallop or click PMI normal Abdomen: benighn, BS positve, no tenderness, no AAA no bruit.  No HSM or HJR Distal pulses intact with no bruits No edema Neuro non-focal Skin warm and dry No muscular weakness    Radiology: No results found.   EKG: 06/26/2022 SR rate 64 Q wave in 3, F    ASSESSMENT AND PLAN:   1. 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 Has had progression of disease slowly over the last few years. Gradients in moderate range but DVI/AVA more severe He is young for TAVR and does not want to think about SAVR Will start with TAVR CTA to assess valve score and annular size. Depending on coronary calcium score may need stress testing Discussed likely need for SAVR in next year If CTA not high risk will likely repeat TTE in 6 months since he is asymptomatic Given root size of 4.3 in 2021 will likely need Bental Will size root on CTA again   2. Aortic Aneurysm:  4.3 cm by CTA 06/24/19  observe see below   3. HLD  Continue statin labs with primary   4. PVC;s ? Related to ETOH improved with beta blocker if symptoms recur can repeat monitor for PVC burden Seen by Dr Ladona Ridgel and no other Rx recommended other than beta blocker   5. ETOH:  Has stopped drinking indicated I thought this was good for his heart   TAVR CTA BMET Lopressor 100 mg 2 hours prior to test  Nitro SL during test   F/U 6 months   Signed: Charlton Haws 06/26/2022, 9:45 AM

## 2022-06-26 ENCOUNTER — Encounter: Payer: Self-pay | Admitting: Cardiovascular Disease

## 2022-06-26 ENCOUNTER — Ambulatory Visit: Payer: Medicare Other | Attending: Cardiovascular Disease | Admitting: Cardiovascular Disease

## 2022-06-26 VITALS — BP 100/68 | HR 64 | Ht 69.0 in | Wt 195.0 lb

## 2022-06-26 DIAGNOSIS — E782 Mixed hyperlipidemia: Secondary | ICD-10-CM

## 2022-06-26 DIAGNOSIS — I350 Nonrheumatic aortic (valve) stenosis: Secondary | ICD-10-CM | POA: Diagnosis not present

## 2022-06-26 DIAGNOSIS — I7121 Aneurysm of the ascending aorta, without rupture: Secondary | ICD-10-CM

## 2022-06-26 NOTE — Patient Instructions (Addendum)
Medication Instructions:  Your physician recommends that you continue on your current medications as directed. Please refer to the Current Medication list given to you today.  *If you need a refill on your cardiac medications before your next appointment, please call your pharmacy*  Lab Work: Your physician recommends that you have lab work today- BMET  If you have labs (blood work) drawn today and your tests are completely normal, you will receive your results only by: MyChart Message (if you have MyChart) OR A paper copy in the mail If you have any lab test that is abnormal or we need to change your treatment, we will call you to review the results.  Testing/Procedures: Your physician has requested that you have cardiac CT. Cardiac computed tomography (CT) is a painless test that uses an x-ray machine to take clear, detailed pictures of your heart. For further information please visit [email or web address] Please follow instruction sheet as given.  Follow-Up: At [facility], you and your health needs are our priority.  As part of our continuing mission to provide you with exceptional heart care, we have created designated Provider Care Teams.  These Care Teams include your primary Cardiologist (physician) and Advanced Practice Providers (APPs -  Physician Assistants and Nurse Practitioners) who all work together to provide you with the care you need, when you need it.  We recommend signing up for the patient portal called "MyChart".  Sign up information is provided on this After Visit Summary.  MyChart is used to connect with patients for Virtual Visits (Telemedicine).  Patients are able to view lab/test results, encounter notes, upcoming appointments, etc.  Non-urgent messages can be sent to your provider as well.   To learn more about what you can do with MyChart, go to [email or web address].    Your next appointment:   3 month(s)  Provider:   Charlton Haws, MD     Other  Instructions   Your cardiac CT will be scheduled at one of the below locations:   [facility] [address] [place], [place] [address] [phone]  If scheduled at [facility], please arrive at the [facility]'s Entrance (Entrance C2) of [facility] 30 minutes prior to test start time. You can use the FREE valet parking offered at entrance C (encouraged to control the heart rate for the test)  Proceed to the [facility] Radiology Department (first floor) to check-in and test prep.  All radiology patients and guests should use entrance C2 at [facility], accessed from [facility], even though the hospital's physical address listed is [address]     Please follow these instructions carefully (unless otherwise directed):  Hold all erectile dysfunction medications at least 3 days (72 hrs) prior to test. (Ie viagra, cialis, sildenafil, tadalafil, etc) We will administer nitroglycerin during this exam.   On the Night Before the Test: Be sure to Drink plenty of water. Do not consume any caffeinated/decaffeinated beverages or chocolate 12 hours prior to your test. Do not take any antihistamines 12 hours prior to your test.  On the Day of the Test: Drink plenty of water until 1 hour prior to the test. Do not eat any food 1 hour prior to test. You may take your regular medications prior to the test.   After the Test: Drink plenty of water. After receiving IV contrast, you may experience a mild flushed feeling. This is normal. On occasion, you may experience a mild rash  up to 24 hours after the test. This is not dangerous. If this occurs, you can take Benadryl 25 mg and increase your fluid intake. If you experience trouble breathing, this can be serious. If it is severe call 911 IMMEDIATELY. If it is mild, please call our office.  We will call to schedule your test 2-4 weeks out understanding that some insurance  companies will need an authorization prior to the service being performed.   For non-scheduling related questions, please contact the cardiac imaging nurse navigator should you have any questions/concerns: Rockwell Alexandria, Cardiac Imaging Nurse Navigator Larey Brick, Cardiac Imaging Nurse Navigator [facility] Direct Office Dial: [phone]   For scheduling needs, including cancellations and rescheduling, please call [place], [phone].

## 2022-06-27 LAB — BASIC METABOLIC PANEL
BUN/Creatinine Ratio: 21 (ref 10–24)
BUN: 19 mg/dL (ref 8–27)
CO2: 24 mmol/L (ref 20–29)
Calcium: 9.5 mg/dL (ref 8.6–10.2)
Chloride: 104 mmol/L (ref 96–106)
Creatinine, Ser: 0.91 mg/dL (ref 0.76–1.27)
Glucose: 123 mg/dL — ABNORMAL HIGH (ref 70–99)
Potassium: 4.9 mmol/L (ref 3.5–5.2)
Sodium: 142 mmol/L (ref 134–144)
eGFR: 91 mL/min/{1.73_m2} (ref >59)

## 2022-07-03 ENCOUNTER — Other Ambulatory Visit: Payer: Self-pay | Admitting: Cardiovascular Disease

## 2022-07-04 ENCOUNTER — Encounter: Payer: Self-pay | Admitting: Gastroenterology

## 2022-07-14 ENCOUNTER — Encounter: Payer: Self-pay | Admitting: Gastroenterology

## 2022-07-21 ENCOUNTER — Other Ambulatory Visit: Payer: Self-pay

## 2022-07-21 DIAGNOSIS — I350 Nonrheumatic aortic (valve) stenosis: Secondary | ICD-10-CM

## 2022-07-21 DIAGNOSIS — I7121 Aneurysm of the ascending aorta, without rupture: Secondary | ICD-10-CM

## 2022-07-22 ENCOUNTER — Telehealth (HOSPITAL_COMMUNITY): Payer: Self-pay | Admitting: *Deleted

## 2022-07-22 NOTE — Telephone Encounter (Signed)
Reaching out to patient to offer assistance regarding upcoming cardiac imaging study; pt verbalizes understanding of appt date/time, parking situation and where to check in, pre-test NPO status and medications ordered, and verified current allergies; name and call back number provided for further questions should they arise  Rebekha Diveley RN Navigator Cardiac Imaging [facility] [phone] office [phone] cell  Patient to take 100mg metoprolol tartrate two hours prior to his cardiac CT scan. He is aware to arrive at 3:30pm. 

## 2022-07-23 ENCOUNTER — Ambulatory Visit (HOSPITAL_COMMUNITY)
Admission: RE | Admit: 2022-07-23 | Discharge: 2022-07-23 | Disposition: A | Discharge status: Home / Self Care | Payer: Medicare Other | Source: Ambulatory Visit | Attending: Cardiovascular Disease | Admitting: Cardiovascular Disease

## 2022-07-23 DIAGNOSIS — I350 Nonrheumatic aortic (valve) stenosis: Secondary | ICD-10-CM | POA: Insufficient documentation

## 2022-07-23 DIAGNOSIS — I7121 Aneurysm of the ascending aorta, without rupture: Secondary | ICD-10-CM | POA: Insufficient documentation

## 2022-07-23 MED ORDER — IOHEXOL 350 MG/ML SOLN
95.0000 mL | Freq: Once | INTRAVENOUS | Status: AC | PRN
  Administered 2022-07-23: 95 mL via INTRAVENOUS

## 2022-07-24 ENCOUNTER — Telehealth: Payer: Self-pay

## 2022-07-24 DIAGNOSIS — I4729 Other ventricular tachycardia: Secondary | ICD-10-CM | POA: Insufficient documentation

## 2022-07-24 DIAGNOSIS — I7121 Aneurysm of the ascending aorta, without rupture: Secondary | ICD-10-CM | POA: Insufficient documentation

## 2022-07-24 NOTE — Telephone Encounter (Signed)
This pt has a long cardiac hx, just wanted to see if you think he is ok for the [facility] for his colonoscopy, thank you.

## 2022-07-25 ENCOUNTER — Telehealth: Payer: Self-pay | Admitting: *Deleted

## 2022-07-25 NOTE — Telephone Encounter (Signed)
PCN, Hope you are well. This patient is up for Surveillance colonoscopy in a few weeks. Reading through your notes looks like his AS is more severe and you have considered TAVR v SAVR. His TAVR CT just completed yesterday. Based on your assessment from clinic last month and this scan, do you feel he is an appropriate candidate for surveillance colonoscopy. He is currently scheduled in our outpatient endoscopy unit, but if you feel OK for him to proceed, then I will transition him to our hospital setting for procedure to be completed. Thanks for your time and input. Lamont Dowdy, Thanks for reaching out. Please follow up Dr. Randa Lynn reply. GM

## 2022-07-25 NOTE — Telephone Encounter (Signed)
Dr. Meridee Score,  It would be best to either delay his colonoscopy until post TAVR or perform it in the hospital setting.  Thanks,  Cathlyn Parsons

## 2022-07-26 NOTE — Telephone Encounter (Signed)
Awaiting discussion with Cardiology, and if we can go forward with it, it will need to be in hospital. Thanks. GM

## 2022-07-27 NOTE — Telephone Encounter (Signed)
PCN, Thanks for your help and review. Will proceed with endoscopic surveillance, but I'll get set up for the hospital based outpatient setting. Lamont Dowdy, This patient can be direct set up for procedure with me in the hospital-based setting. Please cancel his [facility] procedure slot and open it up. Next available with me in hospital is OK. Thanks. GM

## 2022-07-29 ENCOUNTER — Other Ambulatory Visit: Payer: Self-pay

## 2022-07-29 ENCOUNTER — Encounter: Payer: Self-pay | Admitting: Gastroenterology

## 2022-07-29 ENCOUNTER — Telehealth: Payer: Self-pay

## 2022-07-29 DIAGNOSIS — Z860100 Personal history of colon polyps, unspecified: Secondary | ICD-10-CM

## 2022-07-29 NOTE — Telephone Encounter (Signed)
Thank you :)

## 2022-07-29 NOTE — Telephone Encounter (Signed)
Cx'd procedure for [facility] and let pt know your nurse will be calling to schedule his hospital colonoscopy, pt verb understanding, left previsit for 5/30 in case we have his hospital schedule by then

## 2022-07-29 NOTE — Telephone Encounter (Signed)
Call to pt to cancel his colonoscopy at [facility] as pt is going to need a hospital procedure, pt verb understanding, left previsit on 5/30 for now in case hospital visit is able to be scheduled in the next 24 hours.

## 2022-07-29 NOTE — Telephone Encounter (Signed)
The pt has been scheduled for 08/11/22 at 945 am at [facility] with GM.

## 2022-07-31 ENCOUNTER — Ambulatory Visit (AMBULATORY_SURGERY_CENTER): Payer: Medicare Other

## 2022-07-31 ENCOUNTER — Encounter: Payer: Self-pay | Admitting: Gastroenterology

## 2022-07-31 VITALS — Ht 69.0 in | Wt 186.0 lb

## 2022-07-31 DIAGNOSIS — Z86010 Personal history of colonic polyps: Secondary | ICD-10-CM

## 2022-07-31 MED ORDER — PEG 3350-KCL-NA BICARB-NACL 420 G PO SOLR
4000.0000 mL | Freq: Once | ORAL | 0 refills | Status: AC
Start: 2022-07-31 — End: 2022-07-31

## 2022-07-31 NOTE — Progress Notes (Signed)
No egg or soy allergy known to patient  No issues known to pt with past sedation with any surgeries or procedures Patient denies ever being told they had issues or difficulty with intubation  No FH of Malignant Hyperthermia Pt is not on diet pills Pt is not on  home 02  Pt is not on blood thinners  Pt denies issues with constipation  No A fib or A flutter Have any cardiac testing pending--no Pt instructed to use Singlecare.com or GoodRx for a price reduction on prep  Patient's chart reviewed by Cathlyn Parsons CNRA prior to previsit and patient appropriate for the [facility].  Previsit completed and red dot placed by patient's name on their procedure day (on provider's schedule).   Can ambulate without assiustance

## 2022-08-04 ENCOUNTER — Encounter (HOSPITAL_COMMUNITY): Payer: Self-pay | Admitting: Gastroenterology

## 2022-08-10 NOTE — Anesthesia Preprocedure Evaluation (Signed)
Anesthesia Evaluation  Patient identified by MRN, date of birth, ID band Patient awake    Reviewed: Allergy & Precautions, NPO status , Patient's Chart, lab work & pertinent test results  History of Anesthesia Complications Negative for: history of anesthetic complications  Airway Mallampati: III  TM Distance: >3 FB Neck ROM: Full    Dental no notable dental hx.    Pulmonary former smoker   Pulmonary exam normal        Cardiovascular Pt. on home beta blockers Normal cardiovascular exam+ dysrhythmias Ventricular Tachycardia   TTE 05/2022: normal EF, moderate AS, mild AR   Neuro/Psych negative neurological ROS     GI/Hepatic Neg liver ROS,GERD  Medicated,,Colon polyps   Endo/Other  negative endocrine ROS    Renal/GU   negative genitourinary   Musculoskeletal negative musculoskeletal ROS (+)    Abdominal   Peds  Hematology negative hematology ROS (+)   Anesthesia Other Findings Day of surgery medications reviewed with patient.  Reproductive/Obstetrics                              Anesthesia Physical Anesthesia Plan  ASA: 3  Anesthesia Plan: MAC   Post-op Pain Management: Minimal or no pain anticipated   Induction:   PONV Risk Score and Plan: 1 and Treatment may vary due to age or medical condition and Propofol infusion  Airway Management Planned: Natural Airway and Nasal Cannula  Additional Equipment: None  Intra-op Plan:   Post-operative Plan:   Informed Consent: I have reviewed the patients History and Physical, chart, labs and discussed the procedure including the risks, benefits and alternatives for the proposed anesthesia with the patient or authorized representative who has indicated his/her understanding and acceptance.       Plan Discussed with: CRNA  Anesthesia Plan Comments:         Anesthesia Quick Evaluation

## 2022-08-11 ENCOUNTER — Ambulatory Visit (HOSPITAL_BASED_OUTPATIENT_CLINIC_OR_DEPARTMENT_OTHER): Payer: Medicare Other | Admitting: Anesthesiology

## 2022-08-11 ENCOUNTER — Encounter (HOSPITAL_COMMUNITY)
Admission: RE | Disposition: A | Discharge status: Home / Self Care | Payer: Self-pay | Source: Home / Self Care | Attending: Gastroenterology

## 2022-08-11 ENCOUNTER — Ambulatory Visit (HOSPITAL_COMMUNITY): Payer: Medicare Other | Admitting: Anesthesiology

## 2022-08-11 ENCOUNTER — Encounter (HOSPITAL_COMMUNITY): Payer: Self-pay | Admitting: Gastroenterology

## 2022-08-11 ENCOUNTER — Ambulatory Visit (HOSPITAL_COMMUNITY): Payer: Medicare Other

## 2022-08-11 ENCOUNTER — Other Ambulatory Visit: Payer: Self-pay

## 2022-08-11 ENCOUNTER — Ambulatory Visit (HOSPITAL_COMMUNITY)
Admission: RE | Admit: 2022-08-11 | Discharge: 2022-08-11 | Disposition: A | Discharge status: Home / Self Care | Payer: Medicare Other | Attending: Gastroenterology | Admitting: Gastroenterology

## 2022-08-11 DIAGNOSIS — Z87891 Personal history of nicotine dependence: Secondary | ICD-10-CM

## 2022-08-11 DIAGNOSIS — K5730 Diverticulosis of large intestine without perforation or abscess without bleeding: Secondary | ICD-10-CM | POA: Diagnosis not present

## 2022-08-11 DIAGNOSIS — K641 Second degree hemorrhoids: Secondary | ICD-10-CM

## 2022-08-11 DIAGNOSIS — K529 Noninfective gastroenteritis and colitis, unspecified: Secondary | ICD-10-CM | POA: Diagnosis not present

## 2022-08-11 DIAGNOSIS — Z1211 Encounter for screening for malignant neoplasm of colon: Secondary | ICD-10-CM | POA: Insufficient documentation

## 2022-08-11 DIAGNOSIS — Z86010 Personal history of colonic polyps: Secondary | ICD-10-CM | POA: Diagnosis not present

## 2022-08-11 DIAGNOSIS — D128 Benign neoplasm of rectum: Secondary | ICD-10-CM

## 2022-08-11 DIAGNOSIS — K635 Polyp of colon: Secondary | ICD-10-CM

## 2022-08-11 DIAGNOSIS — D126 Benign neoplasm of colon, unspecified: Secondary | ICD-10-CM | POA: Diagnosis not present

## 2022-08-11 DIAGNOSIS — I472 Ventricular tachycardia, unspecified: Secondary | ICD-10-CM

## 2022-08-11 DIAGNOSIS — K6389 Other specified diseases of intestine: Secondary | ICD-10-CM

## 2022-08-11 HISTORY — PX: POLYPECTOMY: SHX5525

## 2022-08-11 HISTORY — PX: COLONOSCOPY WITH PROPOFOL: SHX5780

## 2022-08-11 SURGERY — COLONOSCOPY WITH PROPOFOL
Anesthesia: Monitor Anesthesia Care

## 2022-08-11 MED ORDER — SODIUM CHLORIDE 0.9 % IV SOLN: INTRAVENOUS | Status: DC

## 2022-08-11 MED ORDER — PROPOFOL 500 MG/50ML IV EMUL
INTRAVENOUS | Status: DC | PRN
  Administered 2022-08-11: 135 ug/kg/min via INTRAVENOUS

## 2022-08-11 MED ORDER — PROPOFOL 1000 MG/100ML IV EMUL
INTRAVENOUS | Status: AC
  Filled 2022-08-11: qty 100

## 2022-08-11 MED ORDER — PROPOFOL 10 MG/ML IV BOLUS
INTRAVENOUS | Status: DC | PRN
  Administered 2022-08-11: 30 mg via INTRAVENOUS

## 2022-08-11 MED ORDER — LACTATED RINGERS IV SOLN: INTRAVENOUS | Status: DC

## 2022-08-11 MED ORDER — EPHEDRINE SULFATE-NACL 50-0.9 MG/10ML-% IV SOSY
PREFILLED_SYRINGE | INTRAVENOUS | Status: DC | PRN
  Administered 2022-08-11: 10 mg via INTRAVENOUS
  Administered 2022-08-11: 5 mg via INTRAVENOUS

## 2022-08-11 MED ORDER — PHENYLEPHRINE 80 MCG/ML (10ML) SYRINGE FOR IV PUSH (FOR BLOOD PRESSURE SUPPORT)
PREFILLED_SYRINGE | INTRAVENOUS | Status: DC | PRN
  Administered 2022-08-11: 80 ug via INTRAVENOUS

## 2022-08-11 SURGICAL SUPPLY — 22 items

## 2022-08-11 NOTE — Progress Notes (Signed)
Pt presented to endoscopy today for colonoscopy with MD Mansouraty. Post-procedure, pt c/o 1.5/10 chest discomfort in left upper chest. Denies radiation. States pain is alleviated by massaging the area.   MD Mansouraty assessed pt at bedside and gave a verbal order for a STAT CXR and 12 lead ECG. MDA Houser notified and arrived at bedside. 12 lead performed and reviewed by MDA Richardson Landry and MD Mansouraty. CXR obtained. Hot pack applied to pt's chest.   As of 1130, pt denies chest pain / discomfort.  Eulas Post, RN 08/11/22 11:30 AM

## 2022-08-11 NOTE — Discharge Instructions (Signed)

## 2022-08-11 NOTE — Op Note (Signed)
[facility] Patient Name: Christian Galvan Procedure Date: 08/11/2022 MRN: [id number] Attending MD: Corliss Parish , MD, [id number] Date of Birth: 28-Jan-1951 CSN: [id number] Age: 72 Admit Type: Outpatient Procedure:                Colonoscopy Indications:              Surveillance: Personal history of adenomatous                            polyps on last colonoscopy 5 years ago Providers:                Corliss Parish, MD, Margaree Mackintosh, RN,                            Kandice Robinsons, Technician Referring MD:             Martha Clan Medicines:                Monitored Anesthesia Care Complications:            No immediate complications. Estimated Blood Loss:     Estimated blood loss was minimal. Procedure:                Pre-Anesthesia Assessment:                           - Prior to the procedure, a History and Physical                            was performed, and patient medications and                            allergies were reviewed. The patient's tolerance of                            previous anesthesia was also reviewed. The risks                            and benefits of the procedure and the sedation                            options and risks were discussed with the patient.                            All questions were answered, and informed consent                            was obtained. Prior Anticoagulants: The patient has                            taken no anticoagulant or antiplatelet agents. ASA                            Grade Assessment: III - A patient with severe  systemic disease. After reviewing the risks and                            benefits, the patient was deemed in satisfactory                            condition to undergo the procedure.                           After obtaining informed consent, the colonoscope                            was passed under direct vision. Throughout the                             procedure, the patient's blood pressure, pulse, and                            oxygen saturations were monitored continuously. The                            CF-HQ190L ([id number]) Olympus colonoscope was                            introduced through the anus and advanced to the 3                            cm into the ileum. The colonoscopy was performed                            without difficulty. The patient tolerated the                            procedure. The quality of the bowel preparation was                            good. The terminal ileum, ileocecal valve,                            appendiceal orifice, and rectum were photographed. Scope In: 10:23:51 AM Scope Out: 10:40:30 AM Scope Withdrawal Time: 0 hours 13 minutes 8 seconds  Total Procedure Duration: 0 hours 16 minutes 39 seconds  Findings:      The digital rectal exam findings include hemorrhoids. Pertinent       negatives include no palpable rectal lesions.      The terminal ileum appeared normal.      The ileocecal valve was moderately lipomatous (this has been previously       noted on prior colonoscopy report in 2019). Biopsies were taken with a       cold forceps for histology to ensure no evidence of dysplasia.      Two sessile polyps were found in the rectum. The polyps were 2 to 4 mm       in size. These polyps were removed with a cold snare. Resection and  retrieval were complete.      Normal mucosa was found in the entire colon otherwise.      Many medium-mouthed and small-mouthed diverticula were found in the       entire colon.      Non-bleeding non-thrombosed internal hemorrhoids were found during       retroflexion, during perianal exam and during digital exam. The       hemorrhoids were Grade II (internal hemorrhoids that prolapse but reduce       spontaneously). Impression:               - Hemorrhoids found on digital rectal exam.                           - The examined  portion of the ileum was normal.                           - Lipomatous ileocecal valve. Biopsied.                           - Two 2 to 4 mm polyps in the rectum, removed with                            a cold snare. Resected and retrieved.                           - Normal mucosa in the entire examined colon.                           - Diverticulosis in the entire examined colon.                           - Non-bleeding non-thrombosed internal hemorrhoids. Moderate Sedation:      Not Applicable - Patient had care per Anesthesia. Recommendation:           - The patient will be observed post-procedure,                            until all discharge criteria are met.                           - Discharge patient to home.                           - Patient has a contact number available for                            emergencies. The signs and symptoms of potential                            delayed complications were discussed with the                            patient. Return to normal activities tomorrow.  Written discharge instructions were provided to the                            patient.                           - High fiber diet.                           - Use FiberCon 1-2 tablets PO daily.                           - Continue present medications.                           - Await pathology results.                           - Repeat colonoscopy in 5-7 years for surveillance                            based on pathology results and previous history of                            adenomatous polyps.                           - Negative CT abdomen/pelvis earlier this year in                            regards to the lipomatous IC valve, does not need                            to be repeated.                           - The findings and recommendations were discussed                            with the patient.                           - The findings  and recommendations were discussed                            with the patient's family. Procedure Code(s):        --- Professional ---                           [phone], Colonoscopy, flexible; with removal of                            tumor(s), polyp(s), or other lesion(s) by snare                            technique  [address], 59, Colonoscopy, flexible; with biopsy,                            single or multiple Diagnosis Code(s):        --- Professional ---                           Z86.010, Personal history of colonic polyps                           K64.1, Second degree hemorrhoids                           K63.89, Other specified diseases of intestine                           D12.8, Benign neoplasm of rectum                           K57.30, Diverticulosis of large intestine without                            perforation or abscess without bleeding CPT copyright 2022 American Medical Association. All rights reserved. The codes documented in this report are preliminary and upon coder review may  be revised to meet current compliance requirements. Corliss Parish, MD 08/11/2022 10:53:17 AM Number of Addenda: 0

## 2022-08-11 NOTE — Progress Notes (Signed)
Patient denies any further chest pain at this time.  Chest xray negative per Dr. Meridee Score.  Dr. Collins Scotland also in to assess patient.  Both MDs agreeable to discharge home.

## 2022-08-11 NOTE — Progress Notes (Addendum)
Patient evaluated in the postprocedural recovery area. He states he has a 1-1.5 chest discomfort in the left upper chest area.  It does not radiate to his neck or his arm.  Placing his arm on it and massaging this area improves his discomfort and he feels that this is most likely a muscle related issue. He however did not have any chest pain prior to his procedure. I suspect that this is most likely positional related issues as a result of how he was lying on his left side for his colonoscopy, but with his underlying history of aortic aneurysm as well as aortic stenosis, we need to be vigilant.  We are ordering a chest x-ray. We are ordering an EKG. Will review this and then he will get a warm pack to the area. Will discuss with anesthesia but if no contraindication will give a small dose of fentanyl 25 mcg to see how he is doing. If the discomfort is persisting, will likely need to send to the emergency room versus be brought in for admission for observation. Patient's wife has been updated.   Corliss Parish, MD [facility] Office # [phone]   [phone] Addendum Patient's EKG unremarkable and CXR unremarkable.  Patient's pain improved.  Anesthesia reviewed as well.  Patient stable for discharge.  Issues to be concerned with or complication possibilities or progressive pain/discomfort should be relayed to [facility] physician or come into the ED for further evaluation.  He agrees to this plan of action.   Corliss Parish, MD [facility] Office # [phone]

## 2022-08-11 NOTE — Transfer of Care (Signed)
Immediate Anesthesia Transfer of Care Note  Patient: Christian Galvan  Procedure(s) Performed: COLONOSCOPY WITH PROPOFOL POLYPECTOMY  Patient Location: PACU  Anesthesia Type:MAC  Level of Consciousness: drowsy  Airway & Oxygen Therapy: Patient Spontanous Breathing and Patient connected to face mask oxygen  Post-op Assessment: Report given to RN, Post -op Vital signs reviewed and stable, and Patient moving all extremities X 4  Post vital signs: Reviewed and stable  Last Vitals:  Vitals Value Taken Time  BP 105/64   Temp    Pulse 59 08/11/22 1046  Resp 17 08/11/22 1046  SpO2 100 % 08/11/22 1046  Vitals shown include unvalidated device data.  Last Pain:  Vitals:   08/11/22 0833  TempSrc: Temporal  PainSc: 0-No pain         Complications: No notable events documented.

## 2022-08-11 NOTE — H&P (Signed)
GASTROENTEROLOGY PROCEDURE H&P NOTE   Primary Care Physician: [name], MD  HPI: Christian Galvan is a 72 y.o. male who presents for Colonoscopy for surveillance of previous polyps.  Past Medical History:  Diagnosis Date   Abdominal aortic ectasia (HCC)    Aortic dilatation (HCC)    Aortic stenosis    Dysphagia    Dyspnea    GERD (gastroesophageal reflux disease)    Heart murmur    History of kidney stones    Hx of colonic polyps    Hyperlipidemia    Nephrolithiasis    Nocturia    Palpitations    SOB (shortness of breath)    Past Surgical History:  Procedure Laterality Date   COLONOSCOPY  2005&2008   Dr.Weissman   KIDNEY STONE SURGERY  2007   POLYPECTOMY     VASECTOMY     No current facility-administered medications for this encounter.   No current facility-administered medications for this encounter. Allergies  Allergen Reactions   Sulfa Antibiotics Anaphylaxis and Swelling   Family History  Problem Relation Age of Onset   Heart disease Father    Other Mother        old age   Healthy Sister    Colon cancer Neg Hx    Colon polyps Neg Hx    Esophageal cancer Neg Hx    Rectal cancer Neg Hx    Stomach cancer Neg Hx    Social History   Socioeconomic History   Marital status: Married    Spouse name: Not on file   Number of children: Not on file   Years of education: Not on file   Highest education level: Not on file  Occupational History   Not on file  Tobacco Use   Smoking status: Former    Types: Cigarettes   Smokeless tobacco: Never  Substance and Sexual Activity   Alcohol use: Yes    Alcohol/week: 2.0 standard drinks of alcohol    Types: 2 Glasses of wine per week    Comment: wine nightly   Drug use: No   Sexual activity: Not on file  Other Topics Concern   Not on file  Social History Narrative   Not on file   Social Determinants of Health   Financial Resource Strain: Not on file  Food Insecurity: Not on file   Transportation Needs: Not on file  Physical Activity: Not on file  Stress: Not on file  Social Connections: Not on file  Intimate Partner Violence: Not on file    Physical Exam: There were no vitals filed for this visit. There is no height or weight on file to calculate BMI. GEN: NAD EYE: Sclerae anicteric ENT: MMM CV: Non-tachycardic GI: Soft, NT/ND NEURO:  Alert & Oriented x 3  Lab Results: No results for input(s): "WBC", "HGB", "HCT", "PLT" in the last 72 hours. BMET No results for input(s): "NA", "K", "CL", "CO2", "GLUCOSE", "BUN", "CREATININE", "CALCIUM" in the last 72 hours. LFT No results for input(s): "PROT", "ALBUMIN", "AST", "ALT", "ALKPHOS", "BILITOT", "BILIDIR", "IBILI" in the last 72 hours. PT/INR No results for input(s): "LABPROT", "INR" in the last 72 hours.   Impression / Plan: This is a 72 y.o.male who presents for Colonoscopy for surveillance of previous polyps.  The risks and benefits of endoscopic evaluation/treatment were discussed with the patient and/or family; these include but are not limited to the risk of perforation, infection, bleeding, missed lesions, lack of diagnosis, severe illness requiring hospitalization, as well  as anesthesia and sedation related illnesses.  The patient's history has been reviewed, patient examined, no change in status, and deemed stable for procedure.  The patient and/or family is agreeable to proceed.    Justice Britain, MD [facility] Office # [phone]

## 2022-08-11 NOTE — Anesthesia Procedure Notes (Signed)
Procedure Name: MAC Date/Time: 08/11/2022 10:10 AM  Performed by: Nelle Don, CRNAPre-anesthesia Checklist: Patient identified, Patient being monitored, Suction available and Emergency Drugs available Oxygen Delivery Method: Simple face mask

## 2022-08-11 NOTE — Anesthesia Postprocedure Evaluation (Signed)
Anesthesia Post Note  Patient: Christian Galvan  Procedure(s) Performed: COLONOSCOPY WITH PROPOFOL POLYPECTOMY     Patient location during evaluation: PACU Anesthesia Type: MAC Level of consciousness: awake and alert Pain management: pain level controlled Vital Signs Assessment: post-procedure vital signs reviewed and stable Respiratory status: spontaneous breathing, nonlabored ventilation and respiratory function stable Cardiovascular status: blood pressure returned to baseline Postop Assessment: no apparent nausea or vomiting Anesthetic complications: no   No notable events documented.  Last Vitals:  Vitals:   08/11/22 1050 08/11/22 1100  BP: (!) 106/56 113/65  Pulse: 64 64  Resp: 20 18  Temp:    SpO2: 99% 97%    Last Pain:  Vitals:   08/11/22 1050  TempSrc:   PainSc: 0-No pain                 Shanda Howells

## 2022-08-12 LAB — SURGICAL PATHOLOGY

## 2022-08-13 ENCOUNTER — Encounter (HOSPITAL_COMMUNITY): Payer: Self-pay | Admitting: Gastroenterology

## 2022-08-14 ENCOUNTER — Encounter: Payer: Self-pay | Admitting: Gastroenterology

## 2022-08-19 ENCOUNTER — Encounter: Payer: Medicare Other | Admitting: Gastroenterology

## 2022-09-01 NOTE — Progress Notes (Signed)
CARDIOLOGY CONSULT NOTE       Patient ID: Christian Galvan MRN: [id number] DOB/AGE: 70-Apr-1952 70 y.o.  Referring Physician: [name] Primary Physician: [name], MD Primary Cardiologist: [name]   HPI:  72 y.o. referred by Dr [name] 05/27/19 for aortic valve disease, palpitations.  He is a former smoker with HLD on statin .  He had TTE done 05/30/16 with EF 60-65% mild AS/AR mean gradient 13 mmHg peak 25 mmHg Aortic root measured 4.4 cm. F/U echo done 05/10/19 showed progression of disease with mild to moderate AR and moderate AS mean gradient 18 peak 31.5 mmHg DVI 0.33 and AVA 1.54 cm2 Aortic root measured at 4.3 cm He has also had and abdominal US with ectasia of the abdominal aorta but only measuring 2.7 cm  Done as a AAA screening exam  Myovue done 06/23/19 normal no ischemia no EF study not gated due to PVCls CTA 06/23/19 Ascending thoracic aorta 4.3 cm   Monitor done for palpitations 06/29/19 and noted NSVT and 8% PVC burden Started on lopressor 25 mg bid. 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 Annulus 486 suitable for a 26 mm Sapien 3 valve Aorta 4.2 cm and calcium score only 2   He is essentially asymptomatic Has had rare SSCP after raking leaves Like to spend time in [place] like [place]  Has a trip to [place] coming up Has  some vetibular dizzy type symptoms with change in position Was not postural in office BP 115/70 mmHg sitting and standing Says BP low at home sometimes 90 mmHg   ROS All other systems reviewed and negative except as noted above  Past Medical History:  Diagnosis Date   Abdominal aortic ectasia (HCC)    Aortic dilatation (HCC)    Aortic stenosis    Dysphagia    Dyspnea    GERD (gastroesophageal reflux disease)    Heart murmur    History of kidney stones    Hx of colonic polyps    Hyperlipidemia    Nephrolithiasis    Nocturia    Palpitations    SOB (shortness of breath)     Family History  Problem Relation Age of Onset   Heart disease Father    Other Mother        old age   Healthy Sister    Colon cancer Neg Hx    Colon polyps Neg Hx    Esophageal cancer Neg Hx    Rectal cancer Neg Hx    Stomach cancer Neg Hx     Social History   Socioeconomic History   Marital status: Married    Spouse name: Not on file   Number of children: Not on file   Years of education: Not on file  Highest education level: Not on file  Occupational History   Not on file  Tobacco Use   Smoking status: Former    Types: Cigarettes   Smokeless tobacco: Never  Substance and Sexual Activity   Alcohol use: Yes    Alcohol/week: 2.0 standard drinks of alcohol    Types: 2 Glasses of wine per week    Comment: wine nightly   Drug use: No   Sexual activity: Not on file  Other Topics Concern   Not on file  Social History Narrative   Not on file   Social Determinants of Health   Financial Resource Strain: Not on file  Food Insecurity: Not on file  Transportation Needs: Not on file  Physical Activity: Not on file  Stress: Not on file  Social Connections: Not on file  Intimate Partner Violence: Not on file    Past Surgical History:  Procedure Laterality Date   COLONOSCOPY  2005&2008   Dr.Weissman   COLONOSCOPY WITH PROPOFOL N/A 08/11/2022   Procedure: COLONOSCOPY WITH PROPOFOL;  Surgeon:  Lemar Lofty., MD;  Location: [facility];  Service: Gastroenterology;  Laterality: N/A;   KIDNEY STONE SURGERY  2007   POLYPECTOMY     POLYPECTOMY  08/11/2022   Procedure: POLYPECTOMY;  Surgeon: Mansouraty, Netty Starring., MD;  Location: [facility];  Service: Gastroenterology;;   VASECTOMY        Current Outpatient Medications:    atorvastatin (LIPITOR) 20 MG tablet, Take 20 mg by mouth every evening., Disp: , Rfl: 11   metoprolol tartrate (LOPRESSOR) 25 MG tablet, TAKE 1 TABLET BY MOUTH TWICE  DAILY, Disp: 180 tablet, Rfl: 3   Multiple Vitamin (MULTIVITAMIN WITH MINERALS) TABS tablet, Take 1 tablet by mouth daily., Disp: , Rfl:    pantoprazole (PROTONIX) 40 MG tablet, Take 40 mg by mouth at bedtime as needed (acid reflux)., Disp: , Rfl:   Current Facility-Administered Medications:    0.9 %  sodium chloride infusion, 500 mL, Intravenous, Once, Rachael Fee, MD   sodium chloride      Physical Exam: Blood pressure 106/64, pulse 74, height 5\' 10"  (1.778 m), weight 188 lb 12.8 oz (85.6 kg), SpO2 93%.    Affect appropriate Healthy:  appears stated age HEENT: normal Neck supple with no adenopathy JVP normal no bruits no thyromegaly Lungs clear with no wheezing and good diaphragmatic motion Heart:  S1/S2 muffled  AS/AR  murmur, no rub, gallop or click PMI normal Abdomen: benighn, BS positve, no tenderness, no AAA no bruit.  No HSM or HJR Distal pulses intact with no bruits No edema Neuro non-focal Skin warm and dry No muscular weakness    Radiology: No results found.   EKG: 09/11/2022 SR rate 64 Q wave in 3, F    ASSESSMENT AND PLAN:   1. 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 Has had progression of disease slowly over the last few years. Gradients in moderate range but DVI/AVA more severe He is young for TAVR and does not want to think about SAVR CTA shows he would be a candidate for 26 mm Sapien 3  valve Calcium score 1698 which does suggest more severe AS. He is asymptomatic with normal LV and aortic root only 4.2 cm F/U echo March 2025  2. Aortic Aneurysm:  4.2 cm by CTA 07/24/22 observe see below   3. HLD  Continue statin labs with primary   4. PVC;s ? Related to  ETOH improved with beta blocker if symptoms recur can repeat monitor for PVC burden Seen by Dr Ladona Ridgel and no other Rx recommended other than beta blocker Decrease dose given postural symptoms and low BP in heat  5. ETOH:  Has stopped drinking indicated I thought this was good for his heart   6. CAD:  calcium score only 2 had positional atypical SSCP 08/11/22 after colonosocpy ECG/CXR normal    F/U 6 months with echo   Signed: Charlton Haws 09/11/2022, 3:54 PM

## 2022-09-11 ENCOUNTER — Encounter: Payer: Self-pay | Admitting: Cardiovascular Disease

## 2022-09-11 ENCOUNTER — Ambulatory Visit: Payer: Medicare Other | Attending: Cardiovascular Disease | Admitting: Cardiovascular Disease

## 2022-09-11 VITALS — BP 106/64 | HR 74 | Ht 70.0 in | Wt 188.8 lb

## 2022-09-11 DIAGNOSIS — I350 Nonrheumatic aortic (valve) stenosis: Secondary | ICD-10-CM

## 2022-09-11 NOTE — Patient Instructions (Addendum)
Medication Instructions:  Your physician recommends that you continue on your current medications as directed. Please refer to the Current Medication list given to you today.  *If you need a refill on your cardiac medications before your next appointment, please call your pharmacy*  Lab Work: If you have labs (blood work) drawn today and your tests are completely normal, you will receive your results only by: MyChart Message (if you have MyChart) OR A paper copy in the mail If you have any lab test that is abnormal or we need to change your treatment, we will call you to review the results.  Testing/Procedures: Your physician has requested that you have an echocardiogram at office visit in November. Echocardiography is a painless test that uses sound waves to create images of your heart. It provides your doctor with information about the size and shape of your heart and how well your heart's chambers and valves are working. This procedure takes approximately one hour. There are no restrictions for this procedure. Please do NOT wear cologne, perfume, aftershave, or lotions (deodorant is allowed). Please arrive 15 minutes prior to your appointment time.  Follow-Up: At [facility], you and your health needs are our priority.  As part of our continuing mission to provide you with exceptional heart care, we have created designated Provider Care Teams.  These Care Teams include your primary Cardiologist (physician) and Advanced Practice Providers (APPs -  Physician Assistants and Nurse Practitioners) who all work together to provide you with the care you need, when you need it.  We recommend signing up for the patient portal called "MyChart".  Sign up information is provided on this After Visit Summary.  MyChart is used to connect with patients for Virtual Visits (Telemedicine).  Patients are able to view lab/test results, encounter notes, upcoming appointments, etc.  Non-urgent messages can  be sent to your provider as well.   To learn more about what you can do with MyChart, go to [email or web address].    Your next appointment:   01/06/23 at 1:30 PM  Provider:   Charlton Haws, MD

## 2023-01-05 NOTE — Progress Notes (Unsigned)
CARDIOLOGY CONSULT NOTE       Patient ID: Christian Galvan MRN: [id number] DOB/AGE: 70-Nov-1952 70 y.o.  Referring Physician: [name] Primary Physician: [name], MD Primary Cardiologist: [name]   HPI:  72 y.o. referred by Dr [name] 05/27/19 for aortic valve disease, palpitations.  He is a former smoker with HLD on statin .  He had TTE done 05/30/16 with EF 60-65% mild AS/AR mean gradient 13 mmHg peak 25 mmHg Aortic root measured 4.4 cm. F/U echo done 05/10/19 showed progression of disease with mild to moderate AR and moderate AS mean gradient 18 peak 31.5 mmHg DVI 0.33 and AVA 1.54 cm2 Aortic root measured at 4.3 cm He has also had and abdominal US with ectasia of the abdominal aorta but only measuring 2.7 cm  Done as a AAA screening exam  Myovue done 06/23/19 normal no ischemia no EF study not gated due to PVCls CTA 06/23/19 Ascending thoracic aorta 4.3 cm   Monitor done for palpitations 06/29/19 and noted NSVT and 8% PVC burden Started on lopressor 25 mg bid. 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 Annulus 486 suitable for a 26 mm Sapien 3 valve Aorta 4.2 cm and calcium score only 2   Like to spend time in [place] like [employer] He does complain of more dyspnea  walking/hiking in the mountains and walking up hill His symptoms are disproportionate to the stability of his echo   Had trip to [place].  Has some vetibular dizzy type symptoms with change in position Was not postural in office BP 115/70 mmHg sitting and standing Says BP low at home sometimes 90 mmHg   ROS All other systems reviewed and negative except as noted above  Past Medical History:  Diagnosis Date   Abdominal aortic ectasia (HCC)    Aortic dilatation (HCC)    Aortic stenosis    Dysphagia    Dyspnea    GERD (gastroesophageal reflux disease)    Heart murmur    History of kidney stones    Hx of colonic polyps    Hyperlipidemia    Nephrolithiasis    Nocturia    Palpitations    SOB (shortness of breath)     Family History  Problem Relation Age of Onset   Other Mother        old age   Heart disease Father    Healthy Sister    Colon cancer Neg Hx    Colon polyps Neg Hx    Esophageal cancer Neg Hx    Rectal cancer Neg Hx    Stomach cancer Neg Hx     Social History   Socioeconomic History   Marital  status: Married    Spouse name: Not on file   Number of children: Not on file   Years of education: Not on file   Highest education level: Not on file  Occupational History   Not on file  Tobacco Use   Smoking status: Former    Types: Cigarettes   Smokeless tobacco: Never  Vaping Use   Vaping status: Never Used  Substance and Sexual Activity   Alcohol use: Yes    Alcohol/week: 2.0 standard drinks of alcohol    Types: 2 Glasses of wine per week    Comment: wine nightly   Drug use: No   Sexual activity: Not on file  Other Topics Concern   Not on file  Social History Narrative   Not on file   Social Determinants of Health   Financial Resource Strain: Not on file  Food Insecurity: Not on file  Transportation Needs: Not on file  Physical Activity: Not on file  Stress: Not on file  Social Connections: Not on file  Intimate Partner Violence: Not on file     Past Surgical History:  Procedure Laterality Date   COLONOSCOPY  2005&2008   Dr.Weissman   COLONOSCOPY WITH PROPOFOL N/A 08/11/2022   Procedure: COLONOSCOPY WITH PROPOFOL;  Surgeon: Lemar Lofty., MD;  Location: [facility];  Service: Gastroenterology;  Laterality: N/A;   KIDNEY STONE SURGERY  2007   POLYPECTOMY     POLYPECTOMY  08/11/2022   Procedure: POLYPECTOMY;  Surgeon: Mansouraty, Netty Starring., MD;  Location: [facility];  Service: Gastroenterology;;   VASECTOMY        Current Outpatient Medications:    atorvastatin (LIPITOR) 20 MG tablet, Take 20 mg by mouth every evening., Disp: , Rfl: 11   metoprolol tartrate (LOPRESSOR) 25 MG tablet, TAKE 1 TABLET BY MOUTH TWICE  DAILY (Patient taking differently: Take 12.5 mg by mouth 2 (two) times daily.), Disp: 180 tablet, Rfl: 3   Multiple Vitamin (MULTIVITAMIN WITH MINERALS) TABS tablet, Take 1 tablet by mouth daily., Disp: , Rfl:    pantoprazole (PROTONIX) 40 MG tablet, Take 40 mg by mouth at bedtime as needed (acid reflux)., Disp: , Rfl:   Current Facility-Administered Medications:    0.9 %  sodium chloride infusion, 500 mL, Intravenous, Once, Rachael Fee, MD   sodium chloride      Physical Exam: Blood pressure 122/78, pulse 68, resp. rate 16, height 5\' 10"  (1.778 m), weight 196 lb 9.6 oz (89.2 kg), SpO2 95%.    Affect appropriate Healthy:  appears stated age HEENT: normal Neck supple with no adenopathy JVP normal no bruits no thyromegaly Lungs clear with no wheezing and good diaphragmatic motion Heart:  S1/S2 muffled  AS/AR  murmur, no rub, gallop or click PMI normal Abdomen: benighn, BS positve, no tenderness, no AAA no bruit.  No HSM or HJR Distal pulses intact with no bruits No edema Neuro non-focal Skin warm and dry No muscular weakness    Radiology: No results found.   EKG: 01/06/2023 SR rate 64 Q wave in 3, F    ASSESSMENT AND PLAN:   1. Aortic Valve Disease:  Review of echo shows fused  right and left cusp with mobile non coronary cusp. Has had progression of disease slowly over the last few years. Gradients in moderate range but DVI/AVA more severe He is young for TAVR and does not want to think about SAVR CTA shows he would be a candidate for 26 mm Sapien 3 valve Calcium score 1698  which does suggest more severe AS. He appears to have more significant exertional dyspnea will order cardiopulmonary stress test to see if he has cardiac limitation to exercise   2. Aortic Aneurysm:  4.2 cm by CTA 07/24/22 observe see below   3. HLD  Continue statin labs with primary   4. PVC;s ? Related to ETOH improved with beta blocker if symptoms recur can repeat monitor for PVC burden Seen by Dr Ladona Ridgel and no other Rx recommended other than beta blocker Decrease dose given postural symptoms and low BP in heat  5. ETOH:  Has stopped drinking indicated I thought this was good for his heart   6. CAD:  calcium score only 2 had positional atypical SSCP 08/11/22 after colonosocpy ECG/CXR normal    F/U 6 months with echo   Signed: Charlton Haws 01/06/2023, 1:28 PM

## 2023-01-06 ENCOUNTER — Ambulatory Visit: Payer: Medicare Other | Attending: Cardiovascular Disease | Admitting: Cardiovascular Disease

## 2023-01-06 ENCOUNTER — Ambulatory Visit (HOSPITAL_BASED_OUTPATIENT_CLINIC_OR_DEPARTMENT_OTHER): Payer: Medicare Other

## 2023-01-06 ENCOUNTER — Encounter: Payer: Self-pay | Admitting: Cardiovascular Disease

## 2023-01-06 VITALS — BP 122/78 | HR 68 | Resp 16 | Ht 70.0 in | Wt 196.6 lb

## 2023-01-06 DIAGNOSIS — I2510 Atherosclerotic heart disease of native coronary artery without angina pectoris: Secondary | ICD-10-CM | POA: Insufficient documentation

## 2023-01-06 DIAGNOSIS — I350 Nonrheumatic aortic (valve) stenosis: Secondary | ICD-10-CM

## 2023-01-06 DIAGNOSIS — I493 Ventricular premature depolarization: Secondary | ICD-10-CM | POA: Insufficient documentation

## 2023-01-06 DIAGNOSIS — I359 Nonrheumatic aortic valve disorder, unspecified: Secondary | ICD-10-CM | POA: Insufficient documentation

## 2023-01-06 LAB — ECHOCARDIOGRAM COMPLETE
AR max vel: 0.92 cm2
AV Area VTI: 1.01 cm2
AV Area mean vel: 0.89 cm2
AV Mean grad: 25.6 mm[Hg]
AV Peak grad: 41.8 mm[Hg]
Ao pk vel: 3.23 m/s
Area-P 1/2: 3.26 cm2
P 1/2 time: 726 ms
S' Lateral: 2.8 cm

## 2023-01-06 NOTE — Patient Instructions (Signed)
Medication Instructions:  Your physician recommends that you continue on your current medications as directed. Please refer to the Current Medication list given to you today.  *If you need a refill on your cardiac medications before your next appointment, please call your pharmacy*  Lab Work: If you have labs (blood work) drawn today and your tests are completely normal, you will receive your results only by: MyChart Message (if you have MyChart) OR A paper copy in the mail If you have any lab test that is abnormal or we need to change your treatment, we will call you to review the results.  Testing/Procedures: Your physician has recommended that you have a cardiopulmonary stress test (CPX). CPX testing is a non-invasive measurement of heart and lung function. It replaces a traditional treadmill stress test. This type of test provides a tremendous amount of information that relates not only to your present condition but also for future outcomes. This test combines measurements of you ventilation, respiratory gas exchange in the lungs, electrocardiogram (EKG), blood pressure and physical response before, during, and following an exercise protocol.   Follow-Up: At [facility], you and your health needs are our priority.  As part of our continuing mission to provide you with exceptional heart care, we have created designated Provider Care Teams.  These Care Teams include your primary Cardiologist (physician) and Advanced Practice Providers (APPs -  Physician Assistants and Nurse Practitioners) who all work together to provide you with the care you need, when you need it.  We recommend signing up for the patient portal called "MyChart".  Sign up information is provided on this After Visit Summary.  MyChart is used to connect with patients for Virtual Visits (Telemedicine).  Patients are able to view lab/test results, encounter notes, upcoming appointments, etc.  Non-urgent messages can be sent  to your provider as well.   To learn more about what you can do with MyChart, go to [email or web address].    Your next appointment:   6 month(s)  Provider:   Charlton Haws, MD

## 2023-01-27 ENCOUNTER — Telehealth (HOSPITAL_COMMUNITY): Payer: Self-pay | Admitting: Surgery

## 2023-01-27 NOTE — Telephone Encounter (Signed)
Patient called to attempt to move up CPX appt from 1:30 to 2:00pm on Dec 2.  I left a message for patient to return the call.

## 2023-02-02 ENCOUNTER — Ambulatory Visit (HOSPITAL_COMMUNITY): Payer: Medicare Other | Attending: Cardiovascular Disease

## 2023-02-02 DIAGNOSIS — I493 Ventricular premature depolarization: Secondary | ICD-10-CM | POA: Diagnosis not present

## 2023-02-02 DIAGNOSIS — I359 Nonrheumatic aortic valve disorder, unspecified: Secondary | ICD-10-CM | POA: Diagnosis present

## 2023-02-02 DIAGNOSIS — R0600 Dyspnea, unspecified: Secondary | ICD-10-CM | POA: Diagnosis not present

## 2023-02-02 DIAGNOSIS — I2510 Atherosclerotic heart disease of native coronary artery without angina pectoris: Secondary | ICD-10-CM | POA: Diagnosis present

## 2023-02-02 DIAGNOSIS — R0609 Other forms of dyspnea: Secondary | ICD-10-CM | POA: Diagnosis not present

## 2023-02-04 ENCOUNTER — Telehealth: Payer: Self-pay

## 2023-02-04 DIAGNOSIS — I493 Ventricular premature depolarization: Secondary | ICD-10-CM

## 2023-02-04 NOTE — Telephone Encounter (Signed)
The patient has been notified of the result and verbalized understanding.  All questions (if any) were answered. Ethelda Chick, RN 02/04/2023 5:48 PM    Ordered monitor.

## 2023-02-04 NOTE — Telephone Encounter (Signed)
-----   Message from Charlton Haws sent at 02/02/2023  3:05 PM EST ----- CPK normal so AS does not need Rx now repeat monitor for PVC burden last done in 2021 and had lot of PVCls during test 30 day

## 2023-02-12 DIAGNOSIS — I493 Ventricular premature depolarization: Secondary | ICD-10-CM

## 2023-03-06 ENCOUNTER — Telehealth: Payer: Self-pay | Admitting: Cardiovascular Disease

## 2023-03-06 NOTE — Telephone Encounter (Signed)
 Marisue Ivan from [employer]  1242 non sustatined run of Vtach 151 bpm  Patient asymptomatic

## 2023-03-06 NOTE — Telephone Encounter (Signed)
 Strip scanned to chart under media tab

## 2023-03-06 NOTE — Telephone Encounter (Signed)
 Marisue Ivan with [employer] calling to report a critical report from monitor

## 2023-03-06 NOTE — Telephone Encounter (Signed)
 Called and spoke with patient who states [place]'s [place] his cabin and was packing at time VT was recorded. He denies any laborious tasks. He didn't feel the episode and reports no abnormal feelings, specifically no chest palpitations, dizziness, or SOB. Does take metoprolol  12.5mg  twice daily and did take dose this morning.    Cardiac Monitor Alert  Date of alert:  03/06/2023   Patient Name: Christian Galvan  DOB: 1950-05-18  MRN: [id number]   [facility] Cardiologist: Maude Emmer, MD  [facility] EP:  None    Monitor Information: Cardiac Event Monitor [[employer]]  Reason:  PVC's, exertional dizziness, dyspnea Ordering provider:  Nishan   Alert Ventricular Tachycardia This is the 1st alert for this rhythm.   Next Cardiology Appointment  Date:  07/06/23  Provider:  Emmer  The patient was contacted today.  He is asymptomatic. He was completely unaware of event. Arrhythmia, symptoms and history reviewed with [name].   Plan:  No changes, EF is normal, non-cerning. Notes he's having PVC's of different morphology than the v-tach.  Lauraine MARLA Bonus, RN  03/06/2023 1:39 PM

## 2023-03-17 ENCOUNTER — Ambulatory Visit: Payer: Medicare Other | Attending: Cardiovascular Disease

## 2023-03-17 DIAGNOSIS — I493 Ventricular premature depolarization: Secondary | ICD-10-CM

## 2023-03-19 ENCOUNTER — Telehealth: Payer: Self-pay

## 2023-03-19 DIAGNOSIS — R9431 Abnormal electrocardiogram [ECG] [EKG]: Secondary | ICD-10-CM

## 2023-03-19 DIAGNOSIS — I493 Ventricular premature depolarization: Secondary | ICD-10-CM

## 2023-03-19 NOTE — Telephone Encounter (Signed)
-----   Message from Christian Galvan sent at 03/17/2023  2:54 PM EST ----- High PVC burden 13% recent cardiopulmonary stress test ok and EF normal does have AS we are following On beta blocker Has seen Ladona Ridgel in past would have him f/u with EP/Taylor

## 2023-03-19 NOTE — Telephone Encounter (Signed)
The patient has been notified of the result and verbalized understanding.  All questions (if any) were answered. Cindi Carbon Felicity, RN 03/19/2023 1:59 PM   Placed referral to Dr. Ladona Ridgel.

## 2023-04-27 ENCOUNTER — Encounter: Payer: Self-pay | Admitting: Internal Medicine

## 2023-04-27 ENCOUNTER — Ambulatory Visit: Payer: Medicare Other

## 2023-04-27 ENCOUNTER — Ambulatory Visit: Payer: Medicare Other | Attending: Internal Medicine | Admitting: Internal Medicine

## 2023-04-27 VITALS — BP 102/62 | HR 76 | Ht 70.0 in | Wt 193.2 lb

## 2023-04-27 DIAGNOSIS — I493 Ventricular premature depolarization: Secondary | ICD-10-CM

## 2023-04-27 NOTE — Patient Instructions (Addendum)
 Medication Instructions:  Your physician recommends that you continue on your current medications as directed. Please refer to the Current Medication list given to you today.  *If you need a refill on your cardiac medications before your next appointment, please call your pharmacy*  Lab Work: None ordered.  You may go to any [facility] Location for your lab work:  [facility] - [address] ([facility]) - [address] [id number]  [place] - [address]   [place]  - [address]   [place] - [address] - [address] [employer]  - [address] - [address] ([facility]   If you have labs (blood work) drawn today and your tests are completely normal, you will receive your results only by: [employer] (if you have MyChart)  If you have any lab test that is abnormal or we need to change your treatment, we will call you or send a MyChart message to review the results.  Testing/Procedures: 3 day Zio in January of 2026  Follow-Up: At [facility], you and your health needs are our priority.  As part of our continuing mission to provide you with exceptional heart care, we have created designated Provider Care Teams.  These Care Teams include your primary Cardiologist (physician) and Advanced Practice Providers (APPs -  Physician Assistants and Nurse Practitioners) who all work together to provide you with the care you need, when you need it.  Your next appointment:   1 year(s)  The format for your next appointment:   In Person  Provider:   Dr Cherly Beach, MD{or one of the following Advanced Practice Providers on your designated Care Team:   Francis Dowse, PA-C Casimiro Needle "Mardelle Matte" [place], [place] Earnest Rosier, NP  Note: Remote monitoring is used to monitor your Pacemaker/ ICD from home. This monitoring reduces the number of office visits required  to check your device to one time per year. It allows [place] to keep an eye on the functioning of your device to ensure it is working properly.            Valet parking services will be available as well.

## 2023-04-27 NOTE — Progress Notes (Unsigned)
 Enrolled for [employer] to mail a ZIO XT long term holter monitor to the patients address on file.

## 2023-04-27 NOTE — Progress Notes (Signed)
 HPI Christian Galvan returns today to re-establish after a long absence from our EP clinic at the request of Dr. [name]. The patient is a 73 yo man with a h/o HTN, aortic valve disease and PVC's. I saw him over 3 years ago and encouraged him to reduce ETOH consumption. As time as gone on he has developed worsening AS/AI. He has preserved LV function. He had a repat zio monitor showing 13% PVC's. He returns for additional evaluation. He remains on low dose beta blocker. He has no palpitations.  Allergies  Allergen Reactions   Sulfa Antibiotics Anaphylaxis and Swelling     Current Outpatient Medications  Medication Sig Dispense Refill   atorvastatin (LIPITOR) 20 MG tablet Take 20 mg by mouth every evening.  11   metoprolol tartrate (LOPRESSOR) 25 MG tablet TAKE 1 TABLET BY MOUTH TWICE  DAILY (Patient taking differently: Take 12.5 mg by mouth 2 (two) times daily.) 180 tablet 3   Multiple Vitamin (MULTIVITAMIN WITH MINERALS) TABS tablet Take 1 tablet by mouth daily.     pantoprazole (PROTONIX) 40 MG tablet Take 40 mg by mouth at bedtime as needed (acid reflux).     Current Facility-Administered Medications  Medication Dose Route Frequency Provider Last Rate Last Admin   0.9 %  sodium chloride infusion  500 mL Intravenous Once Rachael Fee, MD         Past Medical History:  Diagnosis Date   Abdominal aortic ectasia (HCC)    Aortic dilatation (HCC)    Aortic stenosis    Dysphagia    Dyspnea    GERD (gastroesophageal reflux disease)    Heart murmur    History of kidney stones    Hx of colonic polyps    Hyperlipidemia    Nephrolithiasis    Nocturia    Palpitations    SOB (shortness of breath)     ROS:   All systems reviewed and negative except as noted in the HPI.   Past Surgical History:  Procedure Laterality Date   COLONOSCOPY  2005&2008   Dr.Weissman   COLONOSCOPY WITH PROPOFOL N/A 08/11/2022   Procedure: COLONOSCOPY WITH PROPOFOL;  Surgeon: Meridee Score Netty Starring., MD;  Location: [facility];  Service: Gastroenterology;  Laterality: N/A;   KIDNEY STONE SURGERY  2007   POLYPECTOMY     POLYPECTOMY  08/11/2022   Procedure: POLYPECTOMY;  Surgeon: Mansouraty, Netty Starring., MD;  Location: [facility];  Service: Gastroenterology;;   VASECTOMY       Family History  Problem Relation Age of Onset   Other Mother        old age   Heart disease Father    Healthy Sister    Colon cancer Neg Hx    Colon polyps Neg Hx    Esophageal cancer Neg Hx    Rectal cancer Neg Hx    Stomach cancer Neg Hx      Social History   Socioeconomic History   Marital status: Married    Spouse name: Not on file   Number of children: Not on file   Years of education: Not on file   Highest education level: Not on file  Occupational History   Not on file  Tobacco Use   Smoking status: Former    Types: Cigarettes   Smokeless tobacco: Never  Vaping Use   Vaping status: Never Used  Substance and Sexual Activity   Alcohol use: Yes    Alcohol/week: 2.0 standard drinks of alcohol  Types: 2 Glasses of wine per week    Comment: wine nightly   Drug use: No   Sexual activity: Not on file  Other Topics Concern   Not on file  Social History Narrative   Not on file   Social Drivers of Health   Financial Resource Strain: Not on file  Food Insecurity: Not on file  Transportation Needs: Not on file  Physical Activity: Not on file  Stress: Not on file  Social Connections: Not on file  Intimate Partner Violence: Not on file     BP 102/62   Pulse 76   Ht 5\' 10"  (1.778 m)   Wt 87.6 kg   SpO2 94%   BMI 27.72 kg/m   Physical Exam:  Well appearing NAD HEENT: Unremarkable Neck:  No JVD, no thyromegally Lymphatics:  No adenopathy Back:  No CVA tenderness Lungs:  Clear HEART:  Regular rate rhythm, 2/6 murmur of AS, no rubs, no clicks Abd:  soft, positive bowel sounds, no organomegally, no rebound, no guarding Ext:  2 plus pulses, no edema, no cyanosis, no  clubbing Skin:  No rashes no nodules Neuro:  CN II through XII intact, motor grossly intact  EKG - nsr with PVC's, multifocal   Assess/Plan: PVC"s - I have discussed the treatment options. Asymptomatic. With 13% PVC burden, no indication for AA drug. I'll plan to recheck his PVC's with a 3 day zio in a year.  AS/AI - timing of surgery as per Dr. [name]. He is getting closer.   Sharlot Gowda Audrina Marten,MD

## 2023-04-29 ENCOUNTER — Encounter: Payer: Self-pay | Admitting: Internal Medicine

## 2023-04-29 ENCOUNTER — Ambulatory Visit: Payer: Medicare Other | Attending: Internal Medicine

## 2023-04-29 ENCOUNTER — Other Ambulatory Visit: Payer: Self-pay | Admitting: *Deleted

## 2023-04-29 DIAGNOSIS — I493 Ventricular premature depolarization: Secondary | ICD-10-CM

## 2023-04-29 NOTE — Progress Notes (Unsigned)
 Enrolled for [employer] to mail a ZIO XT long term holter monitor to the patients address on file. Requested monitor to be mailed 03/07/2024.

## 2023-05-14 ENCOUNTER — Other Ambulatory Visit: Payer: Self-pay | Admitting: Cardiovascular Disease

## 2023-06-22 NOTE — Progress Notes (Signed)
 CARDIOLOGY CONSULT NOTE       Patient ID: Christian Galvan MRN: [id number] DOB/AGE: [birth year] 73 y.o.  Referring Physician: Bernetta Brilliant Primary Physician: Jeannine Milroy., MD Primary Cardiologist: Stann Earnest   HPI:  73 y.o. referred by Dr Bernetta Brilliant 05/27/19 for aortic valve disease, palpitations.  He is a former smoker with HLD on statin .  He had TTE done 05/30/16 with EF 60-65% mild AS/AR mean gradient 13 mmHg peak 25 mmHg Aortic root measured 4.4 cm. F/U echo done 05/10/19 showed progression of disease with mild to moderate AR and moderate AS mean gradient 18 peak 31.5 mmHg DVI 0.33 and AVA 1.54 cm2 Aortic root measured at 4.3 cm He has also had and abdominal US  with ectasia of the abdominal aorta but only measuring 2.7 cm  Done as a AAA screening exam  Myovue done 06/23/19 normal no ischemia no EF study not gated due to PVCls CTA 06/23/19 Ascending thoracic aorta 4.3 cm   Monitor done for palpitations 06/29/19 and noted NSVT and 8% PVC burden Started on lopressor  25 mg bid. Had f/u with EP Dr Carolynne Citron 08/03/19 and symptoms  Much improved He admonished him about his ETOH and did not think further w/u needed  Has stopped drinking  Quit smoking in 1970   He and wife are retired. 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 Annulus 486 suitable for a 26 mm Sapien 3 valve Aorta 4.2 cm and calcium score only 2   Like to spend time in [place] like [employer] He does complain of more dyspnea  walking/hiking in the mountains and walking up hill His symptoms are disproportionate to the stability of his echo   Had trip to [place].  Has some vetibular dizzy type symptoms with change in position Was not postural in office BP 115/70 mmHg sitting and standing Says BP low at home sometimes 90 mmHg   Monitor 03/19/23 with 13% PVC burden felt observation warranted on office visit with Dr Carolynne Citron 04/27/23 since asymptomatic and EF normal Still on lopressor  25 mg bid   Needs f/u TTE November this year   Going to [place] this month and [place] cruise with [employer] in November    ROS All other systems reviewed and negative except as noted above  Past Medical History:  Diagnosis Date   Abdominal aortic ectasia (HCC)    Aortic dilatation (HCC)    Aortic stenosis    Dysphagia    Dyspnea    GERD (gastroesophageal reflux disease)    Heart murmur    History of kidney stones    Hx of colonic polyps    Hyperlipidemia    Nephrolithiasis    Nocturia    Palpitations    SOB (shortness of breath)     Family History  Problem Relation Age of Onset   Other Mother        old age   Heart disease Father  Healthy Sister    Colon cancer Neg Hx    Colon polyps Neg Hx    Esophageal cancer Neg Hx    Rectal cancer Neg Hx    Stomach cancer Neg Hx     Social History   Socioeconomic History   Marital status: Married    Spouse name: Not on file   Number of children: Not on file   Years of education: Not on file   Highest education level: Not on file  Occupational History   Not on file  Tobacco Use   Smoking status: Former    Types: Cigarettes   Smokeless tobacco: Never  Vaping Use   Vaping status: Never Used  Substance and Sexual Activity   Alcohol use: Yes    Alcohol/week: 2.0 standard drinks of alcohol    Types: 2 Glasses of wine per week    Comment: wine nightly   Drug use: No   Sexual activity: Not on file  Other Topics Concern   Not on file  Social History Narrative    Not on file   Social Drivers of Health   Financial Resource Strain: Not on file  Food Insecurity: Not on file  Transportation Needs: Not on file  Physical Activity: Not on file  Stress: Not on file  Social Connections: Not on file  Intimate Partner Violence: Not on file    Past Surgical History:  Procedure Laterality Date   COLONOSCOPY  2005&2008   Dr.Weissman   COLONOSCOPY WITH PROPOFOL  N/A 08/11/2022   Procedure: COLONOSCOPY WITH PROPOFOL ;  Surgeon: Mansouraty, Albino Alu., MD;  Location: [facility];  Service: Gastroenterology;  Laterality: N/A;   KIDNEY STONE SURGERY  2007   POLYPECTOMY     POLYPECTOMY  08/11/2022   Procedure: POLYPECTOMY;  Surgeon: Mansouraty, Albino Alu., MD;  Location: [facility];  Service: Gastroenterology;;   VASECTOMY        Current Outpatient Medications:    atorvastatin (LIPITOR) 20 MG tablet, Take 20 mg by mouth every evening., Disp: , Rfl: 11   metoprolol  tartrate (LOPRESSOR ) 25 MG tablet, TAKE 1 TABLET BY MOUTH TWICE  DAILY, Disp: 180 tablet, Rfl: 2   Multiple Vitamin (MULTIVITAMIN WITH MINERALS) TABS tablet, Take 1 tablet by mouth daily., Disp: , Rfl:    pantoprazole (PROTONIX) 40 MG tablet, Take 40 mg by mouth at bedtime as needed (acid reflux)., Disp: , Rfl:   Current Facility-Administered Medications:    0.9 %  sodium chloride  infusion, 500 mL, Intravenous, Once, Janel Medford, MD   sodium chloride       Physical Exam: Blood pressure 110/72, pulse 60, height 5\' 10"  (1.778 m), weight 196 lb (88.9 kg), SpO2 96%.    Affect appropriate Healthy:  appears stated age HEENT: normal Neck supple with no adenopathy JVP normal no bruits no thyromegaly Lungs clear with no wheezing and good diaphragmatic motion Heart:  S1/S2 muffled  AS/AR  murmur, no rub, gallop or click PMI normal Abdomen: benighn, BS positve, no tenderness, no AAA no bruit.  No HSM or HJR Distal pulses intact with no bruits No edema Neuro non-focal Skin warm and  dry No muscular weakness    Radiology: No results found.   EKG: 07/06/2023 SR rate 64 Q wave in 3, F    ASSESSMENT AND PLAN:   1. 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 Has had progression of disease slowly over the last few years. Gradients in moderate range  but DVI/AVA more severe He is young for TAVR and does not want to think about SAVR CTA shows he would be a candidate for 26 mm Sapien 3 valve Calcium score 1698 which does suggest more severe AS. Cardiopulmonary stress test done 02/02/23 normal with no cardiopulmonary limitations   2. Aortic Aneurysm:  4.2 cm by CTA 07/24/22 observe see below   3. HLD  Continue statin labs with primary   4. PVC;s ? Related to ETOH improved with beta blocker Still with 13% burden observe per Dr Carolynne Citron on lopressor  25 bid Plan per GT/EP to repeat PVC burden with 3 day Zio January 2026   5. ETOH:  Has stopped drinking indicated I thought this was good for his heart   6. CAD:  calcium score only 2 had positional atypical SSCP 08/11/22 after colonosocpy ECG/CXR normal    F/U 6 months with echo November 2025   Signed: Janelle Mediate 07/06/2023, 8:32 AM

## 2023-07-06 ENCOUNTER — Encounter: Payer: Self-pay | Admitting: Cardiovascular Disease

## 2023-07-06 ENCOUNTER — Ambulatory Visit: Payer: Medicare Other | Attending: Cardiovascular Disease | Admitting: Cardiovascular Disease

## 2023-07-06 VITALS — BP 110/72 | HR 60 | Ht 70.0 in | Wt 196.0 lb

## 2023-07-06 DIAGNOSIS — I350 Nonrheumatic aortic (valve) stenosis: Secondary | ICD-10-CM

## 2023-07-06 DIAGNOSIS — I493 Ventricular premature depolarization: Secondary | ICD-10-CM

## 2023-07-06 DIAGNOSIS — I7121 Aneurysm of the ascending aorta, without rupture: Secondary | ICD-10-CM

## 2023-07-06 NOTE — Patient Instructions (Signed)
 Medication Instructions:  Your physician recommends that you continue on your current medications as directed. Please refer to the Current Medication list given to you today.  *If you need a refill on your cardiac medications before your next appointment, please call your pharmacy*  Lab Work: If you have labs (blood work) drawn today and your tests are completely normal, you will receive your results only by: MyChart Message (if you have MyChart) OR A paper copy in the mail If you have any lab test that is abnormal or we need to change your treatment, we will call you to review the results.  Testing/Procedures: Your physician has requested that you have an echocardiogram in November. Echocardiography is a painless test that uses sound waves to create images of your heart. It provides your doctor with information about the size and shape of your heart and how well your heart's chambers and valves are working. This procedure takes approximately one hour. There are no restrictions for this procedure. Please do NOT wear cologne, perfume, aftershave, or lotions (deodorant is allowed). Please arrive 15 minutes prior to your appointment time.  Please note: We ask at that you not bring children with you during ultrasound (echo/ vascular) testing. Due to room size and safety concerns, children are not allowed in the ultrasound rooms during exams. Our front office staff cannot provide observation of children in our lobby area while testing is being conducted. An adult accompanying a patient to their appointment will only be allowed in the ultrasound room at the discretion of the ultrasound technician under special circumstances. We apologize for any inconvenience. \  Follow-Up: At [facility], you and your health needs are our priority.  As part of our continuing mission to provide you with exceptional heart care, our providers are all part of one team.  This team includes your primary  Cardiologist (physician) and Advanced Practice Providers or APPs (Physician Assistants and Nurse Practitioners) who all work together to provide you with the care you need, when you need it.  Your next appointment:   6 month(s) same day as echocardiogram.  Provider:   Janelle Mediate, MD    We recommend signing up for the patient portal called "MyChart".  Sign up information is provided on this After Visit Summary.  MyChart is used to connect with patients for Virtual Visits (Telemedicine).  Patients are able to view lab/test results, encounter notes, upcoming appointments, etc.  Non-urgent messages can be sent to your provider as well.   To learn more about what you can do with MyChart, go to [email or web address].

## 2023-10-07 ENCOUNTER — Encounter: Payer: Self-pay | Admitting: Cardiovascular Disease

## 2024-01-03 ENCOUNTER — Other Ambulatory Visit: Payer: Self-pay | Admitting: Cardiovascular Disease

## 2024-01-05 NOTE — Progress Notes (Signed)
 CARDIOLOGY CONSULT NOTE       Patient ID: Christian Galvan MRN: [id number] DOB/AGE: 71/10/1950 71 y.o.  Referring Physician: Loreli Primary Physician: Christian Galvan., MD Primary Cardiologist: Delford   HPI:  73 y.o. referred by Dr Christian 05/27/19 for aortic valve disease, palpitations.  He is a former smoker with HLD on statin .  He had TTE done 05/30/16 with EF 60-65% mild AS/AR mean gradient 13 mmHg peak 25 mmHg Aortic root measured 4.4 cm. F/U echo done 05/10/19 showed progression of disease with mild to moderate AR and moderate AS mean gradient 18 peak 31.5 mmHg DVI 0.33 and AVA 1.54 cm2 Aortic root measured at 4.3 cm He has also had and abdominal US  with ectasia of the abdominal aorta but only measuring 2.7 cm  Done as a AAA screening exam  Myovue done 06/23/19 normal no ischemia no EF study not gated due to PVCls CTA 06/23/19 Ascending thoracic aorta 4.3 cm   Monitor done for palpitations 06/29/19 and noted NSVT and 8% PVC burden Started on lopressor  25 mg bid. Had f/u with EP Dr Waddell 08/03/19 and symptoms  Much improved He admonished him about his ETOH and did not think further w/u needed  Has stopped drinking  Quit smoking in 1970   He and wife are retired. 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 Annulus 486 suitable for a 26 mm Sapien 3 valve Aorta 4.2 cm and calcium score only 2   Like to  spend time in [place] [employer] He does complain of more dyspnea walking/hiking in the mountains and walking up hill His symptoms are disproportionate to the stability of his echo   Had trip to [place] and [place]. Has trip planned with [employer] [place] to [place] end of year  Has some vetibular dizzy type symptoms with change in position Was not postural in office BP 115/70 mmHg sitting and standing Says BP low at home sometimes 90 mmHg   Monitor 03/19/23 with 13% PVC burden felt observation warranted on office visit with Dr Waddell 04/27/23 since asymptomatic and EF normal Still on lopressor  25 mg bid   Echo done 01/12/24 reviewed mild AR and severe AS with mean gradient 37 peak 65 and AVA 0.69 cm2  Long discussion about severity of AS. He is asymptomatic Knows he will need something done likely in next 2 years at most.    ROS All other systems reviewed and negative except as noted above  Past Medical History:  Diagnosis Date   Abdominal aortic ectasia    Aortic dilatation    Aortic stenosis    Dysphagia    Dyspnea    GERD (gastroesophageal reflux disease)    Heart murmur    History of kidney stones    Hx of colonic polyps    Hyperlipidemia  Nephrolithiasis    Nocturia    Palpitations    SOB (shortness of breath)     Family History  Problem Relation Age of Onset   Other Mother        old age   Heart disease Father    Healthy Sister    Colon cancer Neg Hx    Colon polyps Neg Hx    Esophageal cancer Neg Hx    Rectal cancer Neg Hx    Stomach cancer Neg Hx     Social History   Socioeconomic History   Marital status: Married    Spouse name: Not on file   Number of children: Not on file   Years of education: Not on file   Highest education level: Not on file  Occupational History   Not on file  Tobacco Use   Smoking status: Former    Types: Cigarettes   Smokeless tobacco: Never  Vaping Use   Vaping status: Never Used  Substance and Sexual Activity    Alcohol use: Yes    Alcohol/week: 2.0 standard drinks of alcohol    Types: 2 Glasses of wine per week    Comment: wine nightly   Drug use: No   Sexual activity: Not on file  Other Topics Concern   Not on file  Social History Narrative   Not on file   Social Drivers of Health   Financial Resource Strain: Not on file  Food Insecurity: Not on file  Transportation Needs: Not on file  Physical Activity: Not on file  Stress: Not on file  Social Connections: Not on file  Intimate Partner Violence: Not on file    Past Surgical History:  Procedure Laterality Date   COLONOSCOPY  2005&2008   Dr.Weissman   COLONOSCOPY WITH PROPOFOL  N/A 08/11/2022   Procedure: COLONOSCOPY WITH PROPOFOL ;  Surgeon: Mansouraty, Aloha Raddle., MD;  Location: [facility];  Service: Gastroenterology;  Laterality: N/A;   KIDNEY STONE SURGERY  2007   POLYPECTOMY     POLYPECTOMY  08/11/2022   Procedure: POLYPECTOMY;  Surgeon: Mansouraty, Aloha Raddle., MD;  Location: [facility];  Service: Gastroenterology;;   VASECTOMY        Current Outpatient Medications:    atorvastatin (LIPITOR) 20 MG tablet, Take 20 mg by mouth every evening., Disp: , Rfl: 11   metoprolol  tartrate (LOPRESSOR ) 25 MG tablet, TAKE 1 TABLET BY MOUTH TWICE  DAILY, Disp: 180 tablet, Rfl: 2   Multiple Vitamin (MULTIVITAMIN WITH MINERALS) TABS tablet, Take 1 tablet by mouth daily., Disp: , Rfl:    pantoprazole (PROTONIX) 40 MG tablet, Take 40 mg by mouth at bedtime as needed (acid reflux)., Disp: , Rfl:   Current Facility-Administered Medications:    0.9 %  sodium chloride  infusion, 500 mL, Intravenous, Once, Teressa Toribio SQUIBB, MD   sodium chloride       Physical Exam: Blood pressure 116/72, pulse (!) 56, height 5' 10 (1.778 m), weight 192 lb (87.1 kg).    Affect appropriate Healthy:  appears stated age HEENT: normal Neck supple with no adenopathy JVP normal no bruits no thyromegaly Lungs clear with no wheezing and good diaphragmatic  motion Heart:  S1/S2 muffled  AS/AR  murmur, no rub, gallop or click PMI normal Abdomen: benighn, BS positve, no tenderness, no AAA no bruit.  No HSM or HJR Distal pulses intact with no bruits No edema Neuro non-focal Skin warm and dry No muscular weakness    Radiology: ECHOCARDIOGRAM COMPLETE Result Date: 01/12/2024    ECHOCARDIOGRAM  REPORT   Patient Name:   Christian Galvan Date of Exam: 01/12/2024 Medical Rec #:  [id number]        Height:       70.0 in Accession #:    [id number]       Weight:       196.0 lb Date of Birth:  February 15, 1951         BSA:          2.069 m Patient Age:    73 years         BP:           112/73 mmHg Patient Gender: M                HR:           48 bpm. Exam Location:  [address] Procedure: 2D Echo, 3D Echo, Cardiac Doppler, Color Doppler and Strain Analysis            (Both Spectral and Color Flow Doppler were utilized during            procedure). Indications:    I35.9 Aortic Valve disorder  History:        Patient has prior history of Echocardiogram examinations, most                 recent 01/16/2023. Signs/Symptoms:Murmur, Shortness of Breath                 and Palpitations; Risk Factors:HLD.  Sonographer:    Waldo Guadalajara RCS Referring Phys: [id number] Jamara Vary C Demani Weyrauch IMPRESSIONS  1. Left ventricular ejection fraction, by estimation, is 65 to 70%. Left ventricular ejection fraction by 3D volume is 70 %. The left ventricle has normal function. The left ventricle has no regional wall motion abnormalities. There is mild concentric left ventricular hypertrophy. Left ventricular diastolic parameters were normal. The average left ventricular global longitudinal strain is -23.0 %. The global longitudinal strain is normal.  2. Right ventricular systolic function is normal. The right ventricular size is normal. There is normal pulmonary artery systolic pressure.  3. The mitral valve is normal in structure. No evidence of mitral valve regurgitation. No evidence of mitral stenosis.  4.  The aortic valve is calcified. There is moderate calcification of the aortic valve. There is mild thickening of the aortic valve. Aortic valve regurgitation is mild. Severe aortic valve stenosis. Aortic valve area, by VTI measures 0.69 cm. Aortic valve mean gradient measures 37.0 mmHg. Aortic valve Vmax measures 4.03 m/s.  5. There is moderate dilatation of the ascending aorta, measuring 43 mm.  6. The inferior vena cava is normal in size with greater than 50% respiratory variability, suggesting right atrial pressure of 3 mmHg. FINDINGS  Left Ventricle: Left ventricular ejection fraction, by estimation, is 65 to 70%. Left ventricular ejection fraction by 3D volume is 70 %. The left ventricle has normal function. The left ventricle has no regional wall motion abnormalities. The average left ventricular global longitudinal strain is -23.0 %. Strain was performed and the global longitudinal strain is normal. The left ventricular internal cavity size was normal in size. There is mild concentric left ventricular hypertrophy. Left ventricular diastolic parameters were normal. Right Ventricle: The right ventricular size is normal. No increase in right ventricular wall thickness. Right ventricular systolic function is normal. There is normal pulmonary artery systolic pressure. The tricuspid regurgitant velocity is 2.38 m/s, and  with an assumed right atrial pressure of 3 mmHg, the estimated right ventricular  systolic pressure is 25.7 mmHg. Left Atrium: Left atrial size was normal in size. Right Atrium: Right atrial size was normal in size. Pericardium: There is no evidence of pericardial effusion. Presence of epicardial fat layer. Mitral Valve: The mitral valve is normal in structure. No evidence of mitral valve regurgitation. No evidence of mitral valve stenosis. Tricuspid Valve: The tricuspid valve is normal in structure. Tricuspid valve regurgitation is not demonstrated. No evidence of tricuspid stenosis. Aortic Valve:  The aortic valve is calcified. There is moderate calcification of the aortic valve. There is mild thickening of the aortic valve. There is moderate aortic valve annular calcification. Aortic valve regurgitation is mild. Aortic regurgitation  PHT measures 1141 msec. Severe aortic stenosis is present. Aortic valve mean gradient measures 37.0 mmHg. Aortic valve peak gradient measures 65.0 mmHg. Aortic valve area, by VTI measures 0.69 cm. Pulmonic Valve: The pulmonic valve was normal in structure. Pulmonic valve regurgitation is mild. No evidence of pulmonic stenosis. Aorta: There is moderate dilatation of the ascending aorta, measuring 43 mm. Venous: The inferior vena cava is normal in size with greater than 50% respiratory variability, suggesting right atrial pressure of 3 mmHg. IAS/Shunts: No atrial level shunt detected by color flow Doppler. Additional Comments: 3D was performed not requiring image post processing on an independent workstation and was normal.  LEFT VENTRICLE PLAX 2D LVIDd:         4.30 cm         Diastology LVIDs:         2.70 cm         LV e' medial:    7.94 cm/s LV PW:         1.10 cm         LV E/e' medial:  10.3 LV IVS:        1.10 cm         LV e' lateral:   9.79 cm/s LVOT diam:     2.00 cm         LV E/e' lateral: 8.4 LV SV:         68 LV SV Index:   33              2D Longitudinal LVOT Area:     3.14 cm        Strain LV IVRT:       113 msec        2D Strain GLS   -22.4 %                                (A4C):                                2D Strain GLS   -19.0 %                                (A3C):                                2D Strain GLS   -27.6 %                                (A2C):  2D Strain GLS   -23.0 %                                Avg:                                 3D Volume EF                                LV 3D EF:    Left                                             ventricul                                             ar                                              ejection                                             fraction                                             by 3D                                             volume is                                             70 %.                                 3D Volume EF:                                3D EF:        70 %                                LV EDV:       121 ml                                LV ESV:       36 ml  LV SV:        85 ml RIGHT VENTRICLE RV Basal diam:  3.10 cm     PULMONARY VEINS RV S prime:     11.00 cm/s  A Reversal Velocity: 20.80 cm/s TAPSE (M-mode): 2.1 cm      Diastolic Velocity:  37.40 cm/s RVSP:           25.7 mmHg   S/D Velocity:        1.60                             Systolic Velocity:   59.20 cm/s LEFT ATRIUM             Index        RIGHT ATRIUM           Index LA diam:        3.70 cm 1.79 cm/m   RA Pressure: 3.00 mmHg LA Vol (A2C):   52.2 ml 25.22 ml/m  RA Area:     13.70 cm LA Vol (A4C):   31.7 ml 15.32 ml/m  RA Volume:   30.90 ml  14.93 ml/m LA Biplane Vol: 41.2 ml 19.91 ml/m  AORTIC VALVE AV Area (Vmax):    0.60 cm AV Area (Vmean):   0.59 cm AV Area (VTI):     0.69 cm AV Vmax:           403.00 cm/s AV Vmean:          282.000 cm/s AV VTI:            0.983 m AV Peak Grad:      65.0 mmHg AV Mean Grad:      37.0 mmHg LVOT Vmax:         77.40 cm/s LVOT Vmean:        53.400 cm/s LVOT VTI:          0.216 m LVOT/AV VTI ratio: 0.22 AI PHT:            1141 msec  AORTA Ao Root diam: 3.50 cm Ao Asc diam:  4.30 cm MITRAL VALVE               TRICUSPID VALVE MV Area (PHT):             TR Peak grad:   22.7 mmHg MV Decel Time:             TR Vmax:        238.00 cm/s MV E velocity: 81.80 cm/s  Estimated RAP:  3.00 mmHg MV A velocity: 79.10 cm/s  RVSP:           25.7 mmHg MV E/A ratio:  1.03                            SHUNTS                            Systemic VTI:  0.22 m                            Systemic Diam: 2.00 cm Kardie Tobb DO  Electronically signed by Dub Huntsman DO Signature Date/Time: 01/12/2024/11:03:38 AM    Final      EKG: 01/15/2024 SR rate 64 Q wave in 3, F    ASSESSMENT AND PLAN:  1. 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 Has had progression of disease slowly over the last few years. Gradients in moderate range but DVI/AVA more severe He is young for TAVR and does not want to think about SAVR CTA shows he would be a candidate for 26 mm Sapien 3 valve Calcium score 1698 which does suggest more severe AS. Cardiopulmonary stress test done 02/02/23 normal with no cardiopulmonary limitations Asymptomatic but now severe AS Discussed natural history and likely need for TAVR/SAVR evaluation within next year. F/U with me in 6 months with echo Suggested he not travel to remote places for the time being   2. Aortic Aneurysm:  4.2 cm by CTA 07/24/22 observe see below   3. HLD  Continue statin labs with primary   4. PVC;s ? Related to ETOH improved with beta blocker Still with 13% burden observe per Dr Waddell on lopressor  25 bid Plan per GT/EP to repeat PVC burden with 3 day Zio January 2026   5. ETOH:  Has stopped drinking indicated I thought this was good for his heart   6. CAD:  calcium score only 2 had positional atypical SSCP 08/11/22 after colonosocpy ECG/CXR normal   Echo   F/U 6 months    Signed: Maude Emmer 01/15/2024, 8:05 AM

## 2024-01-12 ENCOUNTER — Ambulatory Visit (HOSPITAL_COMMUNITY)
Admission: RE | Admit: 2024-01-12 | Discharge: 2024-01-12 | Disposition: A | Discharge status: Home / Self Care | Source: Ambulatory Visit | Attending: Cardiovascular Disease | Admitting: Cardiovascular Disease

## 2024-01-12 ENCOUNTER — Ambulatory Visit: Payer: Self-pay | Admitting: Cardiovascular Disease

## 2024-01-12 DIAGNOSIS — I493 Ventricular premature depolarization: Secondary | ICD-10-CM | POA: Insufficient documentation

## 2024-01-12 DIAGNOSIS — I7121 Aneurysm of the ascending aorta, without rupture: Secondary | ICD-10-CM | POA: Insufficient documentation

## 2024-01-12 DIAGNOSIS — I350 Nonrheumatic aortic (valve) stenosis: Secondary | ICD-10-CM | POA: Diagnosis present

## 2024-01-12 LAB — ECHOCARDIOGRAM COMPLETE
AR max vel: 0.6 cm2
AV Area VTI: 0.69 cm2
AV Area mean vel: 0.59 cm2
AV Mean grad: 37 mmHg
AV Peak grad: 65 mmHg
Ao pk vel: 4.03 m/s
Area-P 1/2: 2.94 cm2
P 1/2 time: 1141 ms
S' Lateral: 2.7 cm

## 2024-01-15 ENCOUNTER — Ambulatory Visit: Attending: Cardiovascular Disease | Admitting: Cardiovascular Disease

## 2024-01-15 ENCOUNTER — Encounter: Payer: Self-pay | Admitting: Cardiovascular Disease

## 2024-01-15 VITALS — BP 116/72 | HR 56 | Ht 70.0 in | Wt 192.0 lb

## 2024-01-15 DIAGNOSIS — I350 Nonrheumatic aortic (valve) stenosis: Secondary | ICD-10-CM | POA: Diagnosis not present

## 2024-01-15 DIAGNOSIS — I2510 Atherosclerotic heart disease of native coronary artery without angina pectoris: Secondary | ICD-10-CM

## 2024-01-15 DIAGNOSIS — I493 Ventricular premature depolarization: Secondary | ICD-10-CM | POA: Diagnosis not present

## 2024-01-15 NOTE — Patient Instructions (Addendum)
 Medication Instructions:  NO CHANGES  Lab Work: NONE   Testing/Procedures: Your physician has requested that you have an echocardiogram. Echocardiography is a painless test that uses sound waves to create images of your heart. It provides your doctor with information about the size and shape of your heart and how well your heart's chambers and valves are working. This procedure takes approximately one hour. There are no restrictions for this procedure. Please do NOT wear cologne, perfume, aftershave, or lotions (deodorant is allowed). Please arrive 15 minutes prior to your appointment time.  Please note: We ask at that you not bring children with you during ultrasound (echo/ vascular) testing. Due to room size and safety concerns, children are not allowed in the ultrasound rooms during exams. Our front office staff cannot provide observation of children in our lobby area while testing is being conducted. An adult accompanying a patient to their appointment will only be allowed in the ultrasound room at the discretion of the ultrasound technician under special circumstances. We apologize for any inconvenience.   Follow-Up: At [facility], you and your health needs are our priority.  As part of our continuing mission to provide you with exceptional heart care, our providers are all part of one team.  This team includes your primary Cardiologist (physician) and Advanced Practice Providers or APPs (Physician Assistants and Nurse Practitioners) who all work together to provide you with the care you need, when you need it.  Your next appointment:   6 MONTHS (SAME DAY AS ECHOCARDIOGRAM)  Provider:   Maude Emmer, MD

## 2024-03-14 ENCOUNTER — Encounter: Payer: Self-pay | Admitting: Internal Medicine

## 2024-03-14 DIAGNOSIS — I493 Ventricular premature depolarization: Secondary | ICD-10-CM

## 2024-05-02 ENCOUNTER — Ambulatory Visit: Admitting: Student in an Organized Health Care Education/Training Program

## 2024-07-12 ENCOUNTER — Ambulatory Visit: Admitting: Cardiovascular Disease

## 2024-07-12 ENCOUNTER — Ambulatory Visit (HOSPITAL_COMMUNITY)

## 2024-07-14 ENCOUNTER — Ambulatory Visit (HOSPITAL_COMMUNITY)
